# Patient Record
Sex: Female | Born: 1940 | Race: Black or African American | Hispanic: No | Smoking: Never smoker
Health system: Southern US, Community
[De-identification: ages and names within clinical notes are randomized; demographics above are authoritative.]

## PROBLEM LIST (undated history)

## (undated) DIAGNOSIS — E785 Hyperlipidemia, unspecified: Secondary | ICD-10-CM

## (undated) DIAGNOSIS — I10 Essential (primary) hypertension: Secondary | ICD-10-CM

## (undated) HISTORY — PX: COLON SURGERY: SHX602

## (undated) HISTORY — PX: ABDOMINAL SURGERY: SHX537

## (undated) HISTORY — PX: COLOSTOMY REVERSAL: SHX5782

---

## 1999-03-09 ENCOUNTER — Other Ambulatory Visit: Admission: RE | Admit: 1999-03-09 | Discharge: 1999-03-09 | Payer: Self-pay | Admitting: *Deleted

## 2001-03-16 ENCOUNTER — Other Ambulatory Visit: Admission: RE | Admit: 2001-03-16 | Discharge: 2001-03-16 | Payer: Self-pay | Admitting: *Deleted

## 2005-05-05 ENCOUNTER — Encounter: Admission: RE | Admit: 2005-05-05 | Discharge: 2005-08-03 | Payer: Self-pay | Admitting: Family Medicine

## 2008-01-31 ENCOUNTER — Inpatient Hospital Stay (HOSPITAL_COMMUNITY): Admission: RE | Admit: 2008-01-31 | Discharge: 2008-02-03 | Payer: Self-pay | Admitting: Orthopedic Surgery

## 2008-02-08 ENCOUNTER — Ambulatory Visit: Payer: Self-pay

## 2008-12-25 ENCOUNTER — Inpatient Hospital Stay (HOSPITAL_COMMUNITY): Admission: RE | Admit: 2008-12-25 | Discharge: 2008-12-29 | Payer: Self-pay | Admitting: Orthopedic Surgery

## 2009-12-09 ENCOUNTER — Encounter: Admission: RE | Admit: 2009-12-09 | Discharge: 2009-12-09 | Payer: Self-pay | Admitting: Sports Medicine

## 2010-11-24 LAB — CBC
HCT: 22.8 % — ABNORMAL LOW (ref 36.0–46.0)
HCT: 29.7 % — ABNORMAL LOW (ref 36.0–46.0)
HCT: 35.7 % — ABNORMAL LOW (ref 36.0–46.0)
Hemoglobin: 10.3 g/dL — ABNORMAL LOW (ref 12.0–15.0)
Hemoglobin: 8.9 g/dL — ABNORMAL LOW (ref 12.0–15.0)
MCHC: 34.1 g/dL (ref 30.0–36.0)
MCHC: 34.2 g/dL (ref 30.0–36.0)
MCV: 83.7 fL (ref 78.0–100.0)
MCV: 83.8 fL (ref 78.0–100.0)
MCV: 92.1 fL (ref 78.0–100.0)
Platelets: 169 10*3/uL (ref 150–400)
Platelets: 171 10*3/uL (ref 150–400)
Platelets: 211 10*3/uL (ref 150–400)
Platelets: 285 10*3/uL (ref 150–400)
RBC: 2.48 MIL/uL — ABNORMAL LOW (ref 3.87–5.11)
RBC: 3.09 MIL/uL — ABNORMAL LOW (ref 3.87–5.11)
RBC: 3.58 MIL/uL — ABNORMAL LOW (ref 3.87–5.11)
RBC: 3.96 MIL/uL (ref 3.87–5.11)
RDW: 22 % — ABNORMAL HIGH (ref 11.5–15.5)
RDW: 22.4 % — ABNORMAL HIGH (ref 11.5–15.5)
WBC: 5.7 10*3/uL (ref 4.0–10.5)
WBC: 8.2 10*3/uL (ref 4.0–10.5)
WBC: 9.2 10*3/uL (ref 4.0–10.5)

## 2010-11-24 LAB — BASIC METABOLIC PANEL
BUN: 10 mg/dL (ref 6–23)
Calcium: 8 mg/dL — ABNORMAL LOW (ref 8.4–10.5)
Calcium: 8.3 mg/dL — ABNORMAL LOW (ref 8.4–10.5)
Chloride: 94 mEq/L — ABNORMAL LOW (ref 96–112)
Chloride: 95 mEq/L — ABNORMAL LOW (ref 96–112)
Creatinine, Ser: 0.7 mg/dL (ref 0.4–1.2)
Creatinine, Ser: 0.8 mg/dL (ref 0.4–1.2)
Creatinine, Ser: 0.93 mg/dL (ref 0.4–1.2)
GFR calc Af Amer: >60 mL/min (ref >60)
GFR calc Af Amer: >60 mL/min (ref >60)
GFR calc non Af Amer: >60 mL/min (ref >60)
Potassium: 3.5 mEq/L (ref 3.5–5.1)
Potassium: 3.5 mEq/L (ref 3.5–5.1)
Potassium: 4.1 mEq/L (ref 3.5–5.1)
Sodium: 131 mEq/L — ABNORMAL LOW (ref 135–145)
Sodium: 134 mEq/L — ABNORMAL LOW (ref 135–145)

## 2010-11-24 LAB — COMPREHENSIVE METABOLIC PANEL
ALT: 21 U/L (ref 0–35)
AST: 32 U/L (ref 0–37)
Alkaline Phosphatase: 77 U/L (ref 39–117)
BUN: 27 mg/dL — ABNORMAL HIGH (ref 6–23)
Calcium: 9.7 mg/dL (ref 8.4–10.5)
Chloride: 106 mEq/L (ref 96–112)
GFR calc Af Amer: >60 mL/min (ref >60)
GFR calc non Af Amer: >60 mL/min (ref >60)
Glucose, Bld: 98 mg/dL (ref 70–99)
Total Bilirubin: 0.5 mg/dL (ref 0.3–1.2)

## 2010-11-24 LAB — PROTIME-INR
INR: 2.8 — ABNORMAL HIGH (ref 0.00–1.49)
INR: 1 (ref 0.00–1.49)
INR: 2.2 — ABNORMAL HIGH (ref 0.00–1.49)
Prothrombin Time: 31.8 seconds — ABNORMAL HIGH (ref 11.6–15.2)
Prothrombin Time: 13.5 seconds (ref 11.6–15.2)

## 2010-11-24 LAB — TYPE AND SCREEN

## 2010-12-29 NOTE — Op Note (Signed)
NAME:  Mandy Bowman, Mandy Bowman NO.:  [id number]   MEDICAL RECORD NO.:  [id number]          PATIENT TYPE:  INP   LOCATION:  5032                         FACILITY:  [facility]   PHYSICIAN:  Loreta Ave, M.D. DATE OF BIRTH:  06-Dec-1940   DATE OF PROCEDURE:  DATE OF DISCHARGE:                               OPERATIVE REPORT   PREOPERATIVE DIAGNOSES:  End-stage degenerative arthritis, left knee,  varus alignment flexion contracture.   POSTOPERATIVE DIAGNOSES:  End-stage degenerative arthritis, left knee,  varus alignment flexion contracture.   PROCEDURE:  Left total knee replacement, modified minimally invasive  approach.  Scientist, research (medical).  Cemented pegged posterior  stabilized #2 femoral component.  Cemented #2 tibial component with 11-  mm posterior stabilized polyethylene insert.  Resurfacing cemented  pegged medial offset 29-mm patellar component.  Soft tissue balancing  medial capsule release.  Removal of numerous loose bodies and spurs.   SURGEON:  Loreta Ave, M.D.   ASSISTANT:  Genene Churn. Denton Meek., present throughout the entire case.   ANESTHESIA:  General.   BLOOD LOSS:  Minimal.   TOURNIQUET TIME:  1 hour and 20 minutes.   SPECIMENS:  None.   COUNTS:  None.   COMPLICATIONS:  None.   DRESSING:  Soft compressive with knee immobilizer.   DRAINS:  Hemovac x1.   PROCEDURE:  The patient was brought to the operating room and after  adequate anesthesia had been obtained, left knee examined; 5 degrees of  flexion contracture, alignment of varus correctable to neutral.  Flexion  limited greater than 90 degrees.  Tourniquet applied, prepped and draped  in the usual sterile fashion.  Extremity with elevation, Esmarch  tourniquet inflated to 350 mmHg.  Straight incision above the patella  down to tibial tubercle.  Medial arthrotomy to the superomedial border  of patella and then vastus splitting preserving quad tendon.  Knee  exposed.   Periarticular spurs, numerous loose bodies, hypertrophic  synovitis, remnants of menisci, cruciate ligaments removed.  Medial  capsule released.  Distal femur exposed.  Intramedullary guide placed.  Distal cut set at 5 degree of valgus resecting 10 mm.  Using epicondylar  axis sized, cut, and fitted for a #2 posterior stabilized component,  which fit well.  Proximal tibia exposed.  Extramedullary guide 3 degree  of posterior slope cut.  Resected just below the defect medially.  Size  #2 component.  All recess examined to be sure all spurs are removed.  Patella exposed, spurs removed, posterior 9-mm removed, drilled sized,  and fitted for 29-mm component.  Trials put in place.  #2 on the femur,  #2 on the tibia.  With the 11-mm insert full extension, full flexion,  nicely balanced knee.  Also excellent patellofemoral tracking.  Tibia  was marked for rotation and hand reamed.  All trials removed.  Copious  irrigation with a pulse irrigator device.  Cement prepared placed on all  components, which were firmly seated.  Polyethylene attached to tibia.  The knee reduced.  Once cement hardened, it was reexamined.  Full  extension, full flexion, normal mechanical axis, good  alignment, good  stability, good patellofemoral tracking.  Wound irrigated.  Hemovac  placed, brought out through a separate stab wound.  Arthrotomy closed #1  Vicryl, skin and subcutaneous tissue with Vicryl and staples.  Sterile  compressive dressing applied.  Tourniquet inflated and removed.  Knee  immobilizer applied.  Anesthesia reversed.  Brought to recovery room.  Tolerated surgery well.  No complications.      Loreta Ave, M.D.  Electronically Signed     DFM/MEDQ  D:  01/31/2008  T:  02/01/2008  Job:  [id number]

## 2010-12-29 NOTE — Op Note (Signed)
NAME:  Mandy Bowman, Mandy Bowman NO.:  [id number]   MEDICAL RECORD NO.:  [id number]          PATIENT TYPE:  INP   LOCATION:  5028                         FACILITY:  [facility]   PHYSICIAN:  Loreta Ave, M.D. DATE OF BIRTH:  07/18/1941   DATE OF PROCEDURE:  12/25/2008  DATE OF DISCHARGE:                               OPERATIVE REPORT   PREOPERATIVE DIAGNOSIS:  Right knee end-stage degenerative arthritis,  varus alignment.   POSTOPERATIVE DIAGNOSIS:  Right knee end-stage degenerative arthritis,  varus alignment.   PROCEDURE:  Modified minimally invasive right total knee replacement  with St[employer]ker Triathlon prosthesis.  Soft tissue balancing.  Cemented  pegged posterior stabilized #2 femoral component.  Cemented #2 tibial  component 11-mm polyethylene insert.  Resurfacing cemented medial offset  29-mm patellar component.   SURGEON:  Loreta Ave, MD   ASSISTANT:  Genene Churn. Barry Dienes, [place], present throughout the entire case and  necessary for timely completion of procedure.   ANESTHESIA:  General.   BLOOD LOSS:  Minimal.   TOURNIQUET TIME:  1 hour.   SPECIMENS:  None.   CULTURES:  None.   COMPLICATIONS:  None.   DRESSING:  Sterile compressive with knee immobilizer.   DRAIN:  Hemovac x1.   PROCEDURE:  The patient was brought to the operating room and placed on  operating table in supine position.  After adequate anesthesia had been  obtained, right knee examined.  Varus alignment correctable to neutral.  Just about full extension, flexion better than 100 degrees.  Tourniquet  applied, prepped and draped in usual sterile fashion.  Exsanguinated  with elevation and Esmarch.  Tourniquet inflated to 300 mmHg.  Straight  incision above the patella down to tibial tubercle.  Medial arthrotomy  up into the vastus, splitting incision preserving quad tendon.  Knee  exposed.  Grade 4 changes throughout.  Remnants of menisci, cruciate  ligaments.  Periarticular spurs and  loose bodies removed.  Distal femur  exposed.  Intramedullary guide placed.  Distal cut 10-mm set at 5  degrees valgus.  Using epicondylar axis, sized, cut, and fitted for a  posterior stabilized #2 femoral component.  Proximal tibia exposed.  A 3-  degree posterior slope cut.  Extramedullary guide.  Brought down below  the medial defect.  Size to #2 component.  Patella exposed.  Loose  fragments up in the tendon above the patella removed.  Periarticular  spurs removed.  Posterior 9-mm sawed off the back of patella.  Sized,  drilled, and fitted for a 29-mm component.  Trials put in place.  A #2  on the femur, #2 on the tibia, and the 29 on the patella.  With the 11-  mm insert and a medial capsule release, nicely balanced knee and normal  mechanical axis, good alignment, good stability, and good patellofemoral  tracking.  Tibia was marked for appropriate rotation and reamed.  Trials  removed.  The tibia punched for the keel on that component.  Copious  irrigation with a pulse irrigating device.  Cement prepared and placed  on all components which were firmly seated.  Excessive  cement removed.  Polyethylene attached to tibia.  Patellar clamp put in place.  Once the  cement hardened, it was reexamined.  Full extension, full flexion, good  alignment, good stability, and good patellofemoral tracking.  Hemovac  placed through a separate stab wound.  Arthrotomy closed #1 Vicryl.  Skin and subcutaneous tissue with Vicryl and staples.  Knee injected  with Marcaine.  Hemovac clamp.  Sterile compressive dressing applied.  Tourniquet deflated and removed.  Knee immobilizer applied.  Anesthesia  reversed.  Brought to recovery room.  Tolerated surgery well.  No  complications.      Loreta Ave, M.D.  Electronically Signed     Loreta Ave, M.D.  Electronically Signed    DFM/MEDQ  D:  12/25/2008  T:  12/26/2008  Job:  [id number]

## 2011-01-01 NOTE — Discharge Summary (Signed)
NAME:  Mandy Bowman, Mandy Bowman NO.:  [id number]   MEDICAL RECORD NO.:  [id number]          PATIENT TYPE:  INP   LOCATION:  5032                         FACILITY:  [facility]   PHYSICIAN:  Loreta Ave, M.D. DATE OF BIRTH:  1941/01/23   DATE OF ADMISSION:  01/31/2008  DATE OF DISCHARGE:  02/03/2008                               DISCHARGE SUMMARY   FINAL DIAGNOSES:  1. Status post a left total knee replacement for end-stage      degenerative joint disease.  2. Hyperlipidemia.  3. Asthma.  4. Hypertension.  5. Depression.   HISTORY OF PRESENT ILLNESS:  A 70 year old black female with a history  of end-stage DJD left knee and chronic pain presented to our office for  preop evaluation for total knee replacement.  She has progressive  worsening pain, which failed to response with conservative treatment.  Significant decrease in her daily activities due to the ongoing  complaint.   HOSPITAL COURSE:  On January 31, 2008, the patient was taken to the [facility] OR and a left total knee replacement procedure was performed.   SURGEON:  Loreta Ave, M.D.   ASSISTANT:  Genene Churn. Barry Dienes, PA-C   ANESTHESIA:  General.   SPECIMENS:  No specimens.   ESTIMATED BLOOD LOSS:  Minimal.   TOURNIQUET TIME:  1 hour and 26 minutes.   DRAINS:  One Hemovac drain placed.   COMPLICATIONS:  There were no surgical or anesthesia complications, and  the patient was transferred to recovery in stable condition.   On February 01, 2008, the patient was doing well.  She complaint of left  knee pain.  Vitals are stable and is afebrile.  Hemoglobin 9.5 and  hematocrit 27.5.  Dressing is clean, dry, and intact.  Calf nontender.  Neurovascular intact distally.  Hold blood pressure med.  Pharmacy  protocol Coumadin started DVT prophylaxis.  On February 02, 2008, the  patient was doing well.  Pain is controlled.  Vitals are stable and is  afebrile.  Hemoglobin 9.2, hematocrit 26.5, and INR 1.7.  The wound  looks good.  Staples intact.  No drainage or signs of infection.  Hemovac drain discontinued.  Calf nontender.  Neurovascular intact.  Discontinued Foley, PCA and saline locked IV.  Started FeSO4 325 mg p.o.  b.i.d. with meals.  On February 03, 2008, the patient doing extremely well.  Excellent progress with therapy.  She is ready to go home.  Vital signs  stable and is afebrile.  The wound looks good.  Staples intact.  No  drainage or signs of infection.   RECOMMENDATIONS:  Good and stable.   DISPOSITION:  Discharge home.   MEDICATIONS:  1. Percocet 5/325 mg 1 to 2 tablets p.o. every 4-6 hours p.r.n. pain.  2. Robaxin 500 mg 1 tablets p.o. every 6 hours p.r.n. for spasms.  3. Coumadin pharmacy protocol.  4. Resume her previous home meds.   INSTRUCTIONS:  The patient will work with home health PT and OT to  improve ambulation and knee range of motion and strengthening.  Daily  dressing changes with  4 x 4 gauze and tape.  Weightbearing as tolerated.  Coumadin x4 with postoperative DVT prophylaxis.  Follow up in 2 weeks  for postoperative recheck.  Return sooner if needed.      Loreta Ave, M.D.  Electronically Signed     Loreta Ave, M.D.  Electronically Signed    DFM/MEDQ  D:  02/26/2008  T:  02/27/2008  Job:  [id number]

## 2011-01-01 NOTE — Discharge Summary (Signed)
NAME:  Mandy Bowman, Mandy Bowman NO.:  [id number]   MEDICAL RECORD NO.:  [id number]          PATIENT TYPE:  INP   LOCATION:  5028                         FACILITY:  [facility]   PHYSICIAN:  Loreta Ave, M.D. DATE OF BIRTH:  04/15/1941   DATE OF ADMISSION:  12/25/2008  DATE OF DISCHARGE:  12/29/2008                               DISCHARGE SUMMARY   FINAL DIAGNOSES:  1. Status post right total knee replacement for end-stage degenerative      joint disease.  2. Hypertension.  3. Hyperlipidemia.  4. Asthma.  5. Depressive disorder.   HISTORY OF PRESENT ILLNESS:  A 70 year old black female with history of  end-stage DJD, right knee and chronic pain, presented to our office for  preop evaluation for total knee replacement.  She had progressively  worsening pain with failure to response with conservative treatment.  Significant decrease in her daily activities due to the ongoing  complaint.   HOSPITAL COURSE:  On Dec 25, 2008, the patient was taken to the [facility] OR, right total knee replacement procedure performed.  The surgeon  is Mckinley Jewel, and assistant is Zonia Kief, PA-C.  Anesthesia  general with femoral nerve block.  No specimens.  EBL minimal.  Tourniquet time 62 minutes.  One Hemovac drain placed.  There were no  surgical or anesthesia complications and the patient was transferred to  recovery in stable condition.  On Dec 26, 2008, the patient complaining  of right knee pain.  Denied chest pain or shortness of breath.  Vital  signs stable, afebrile.  Hemoglobin 7.9, hematocrit 22.8.  Preop  hemoglobin 12.4.  INR 1.3.  Slight bleeding through dressing.  Neurovascularly intact.  Calf nontender.  Sodium 133.  So, we changed IV  to normal saline plus 20 KCl.  Acute blood loss anemia.  Transfused 2  units of packed red blood cells.  Pharmacy protocol.  Coumadin started  for DVT prophylaxis along with Lovenox.  PT and OT consults.  Dec 27, 2008, the patient  doing well until not better after transfusion.  Temperature 99, pulse 75, respirations 20, blood pressure 134/63.  Hemoglobin 10.3, hematocrit 29.7.  Sodium 131, potassium 2.9, chloride  91, CO2 27, BUN 10, creatinine 0.80, glucose 120, INR 1.4.  Wound looks  good and staples intact.  No drainage or sign of infection.  Hemovac  drain pulled.  KCl 60 mEq p.o. x1 dose.  Repeat BMET in the afternoon.  Dec 28, 2008, the patient doing well.  Vital signs stable, afebrile.  Hemoglobin 8.9, hematocrit 25.9.  Sodium 134, potassium 4.1, chloride  95, CO2 28, BUN 8, creatinine 0.70, glucose 127.  Wound looks good and  staples intact.  No drainage or sign of infection.  Calf nontender.  Neurovascularly intact.  Progressing with therapy.  Dec 29, 2008, the  patient doing well and states she is ready to discharge home.  Temperature 99.4, pulse 74, respirations 18, blood pressure 143/75.  INR  2.8.  Wound looks good and staples intact.  No drainage or sign of  infection.  She has done well  with therapy and ready for discharge home.   CONDITION:  Good and stable.   DISPOSITION:  Discharge home.   MEDICATIONS:  1. Percocet 7.5/325 one to two tablets p.o. q.4-6 h. p.r.n. for pain.  2. Robaxin 500 mg one tablet p.o. q.6 h. for spasms 0.3.  3. Coumadin pharmacy protocol.  Maintain INR 2-3 until 4 weeks postop.  4. Resume previous home meds except for anti-inflammatories.   INSTRUCTIONS:  The patient will work with home health PT and OT to  improve ambulation and knee range of motion and strengthening.  Weightbear as tolerated.  Daily dressing changes with 4 x 4 gauze and  tape.  Coumadin x4 weeks postop for DVT prophylaxis.   FOLLOWUP:  She is 2 weeks postop for recheck.  Return sooner if needed.      Genene Churn. Denton Meek.      Loreta Ave, M.D.  Electronically Signed    JMO/MEDQ  D:  02/18/2009  T:  02/19/2009  Job:  [id number]

## 2011-05-13 LAB — CBC
HCT: 36.9
HCT: 25.3 — ABNORMAL LOW
HCT: 27.5 — ABNORMAL LOW
Hemoglobin: 12.6
Hemoglobin: 9.2 — ABNORMAL LOW
Hemoglobin: 9.5 — ABNORMAL LOW
MCHC: 34.3
MCHC: 34.2
MCHC: 34.7
MCV: 90.2
MCV: 90.9
MCV: 91.1
MCV: 91.8
Platelets: 269
RBC: 2.92 — ABNORMAL LOW
RDW: 12.7
RDW: 12.7
RDW: 13.2

## 2011-05-13 LAB — BASIC METABOLIC PANEL
BUN: 7
CO2: 27
Chloride: 100
Chloride: 101
Chloride: 107
Creatinine, Ser: 0.77
GFR calc Af Amer: >60
GFR calc non Af Amer: 52 — ABNORMAL LOW
Glucose, Bld: 116 — ABNORMAL HIGH
Glucose, Bld: 129 — ABNORMAL HIGH
Glucose, Bld: 95
Potassium: 4.1
Potassium: 4.6
Sodium: 134 — ABNORMAL LOW
Sodium: 135

## 2011-05-13 LAB — PROTIME-INR
INR: 1
Prothrombin Time: 13.5

## 2011-05-13 LAB — COMPREHENSIVE METABOLIC PANEL
BUN: 20
Calcium: 9.4
Creatinine, Ser: 0.78
Glucose, Bld: 94
Total Protein: 7.7

## 2011-05-13 LAB — URINALYSIS, ROUTINE W REFLEX MICROSCOPIC
Bilirubin Urine: NEGATIVE
Glucose, UA: NEGATIVE
Hgb urine dipstick: NEGATIVE
Nitrite: NEGATIVE
Specific Gravity, Urine: 1.018
pH: 6.5

## 2011-05-13 LAB — TYPE AND SCREEN

## 2011-08-13 ENCOUNTER — Other Ambulatory Visit: Payer: Self-pay | Admitting: Sports Medicine

## 2011-08-13 ENCOUNTER — Ambulatory Visit
Admission: RE | Admit: 2011-08-13 | Discharge: 2011-08-13 | Disposition: A | Discharge status: Home / Self Care | Payer: Medicare Other | Source: Ambulatory Visit | Attending: Sports Medicine | Admitting: Sports Medicine

## 2011-08-13 DIAGNOSIS — R52 Pain, unspecified: Secondary | ICD-10-CM

## 2012-09-21 IMAGING — CT CT ELBOW*R* W/O CM
3 of 8 series · 14 of 33 positions shown, 17 images · non-contrast
Comparison: None.

CLINICAL DATA: Fall with elbow pain.

CT OF THE RIGHT ELBOW WITHOUT CONTRAST
TECHNIQUE: Multidetector CT imaging was performed according to the
standard protocol. Multiplanar CT image reconstructions were also
generated.

[Series 7: thin recons · axial · 0.23mm/px · z∈[-37,+22]mm · 6 of 265 slices shown, 8 images]
[im 38/265  soft-tissue]
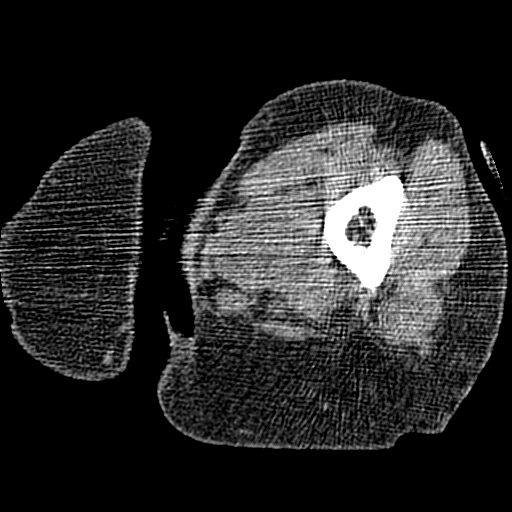
[im 38/265  bone]
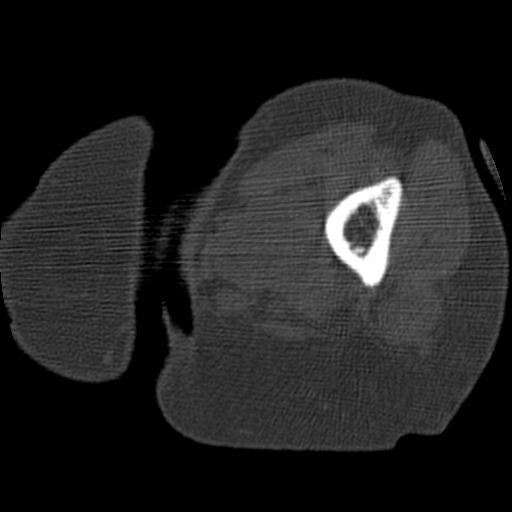
[im 76/265  bone]
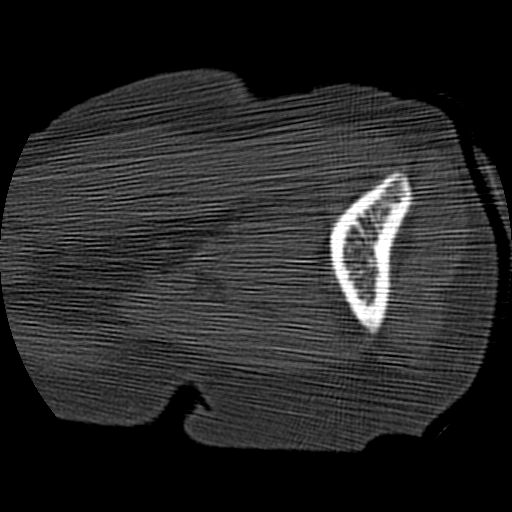
[im 114/265  bone]
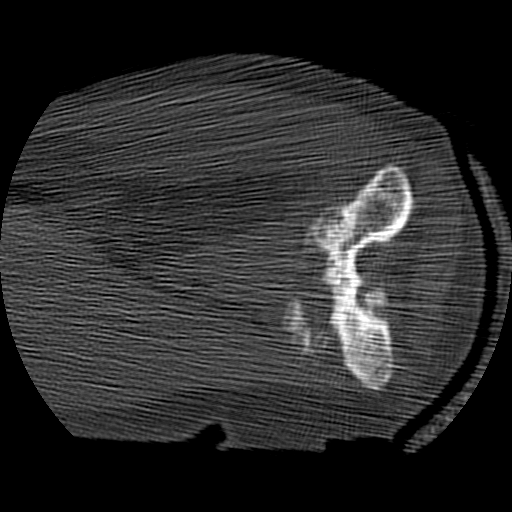
[im 151/265  bone]
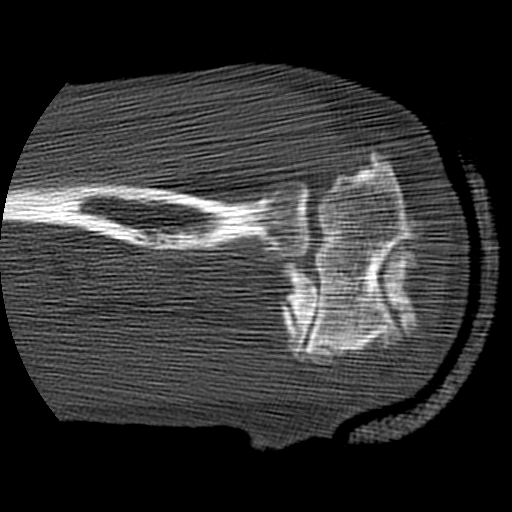
[im 189/265  soft-tissue]
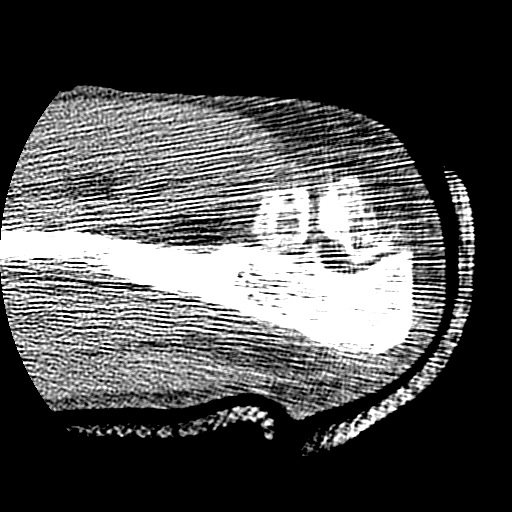
[im 189/265  bone]
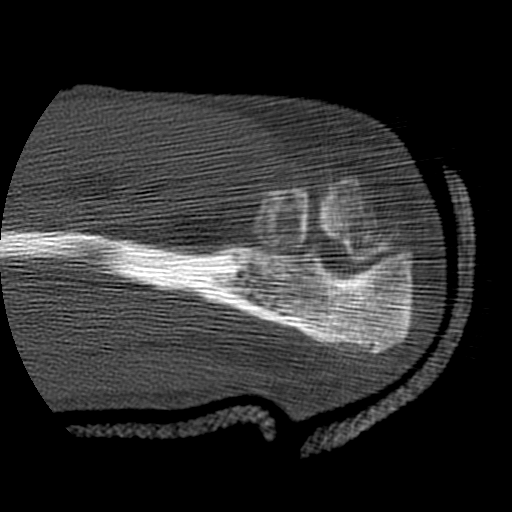
[im 227/265  bone]
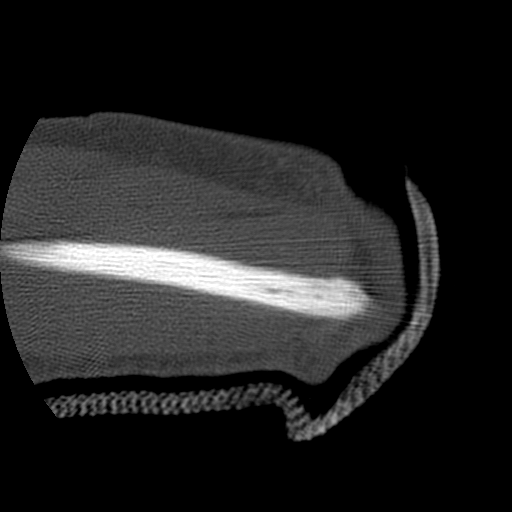

[Series 701: cor · sagittal · 0.23mm/px · 5 of 30 slices shown, 6 images]
[im 10/30  bone]
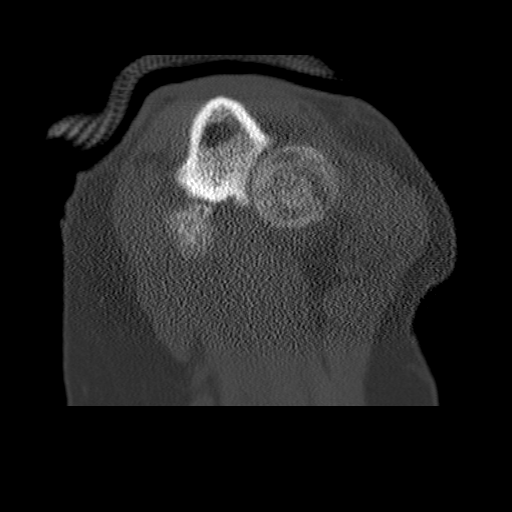
[im 13/30  bone]
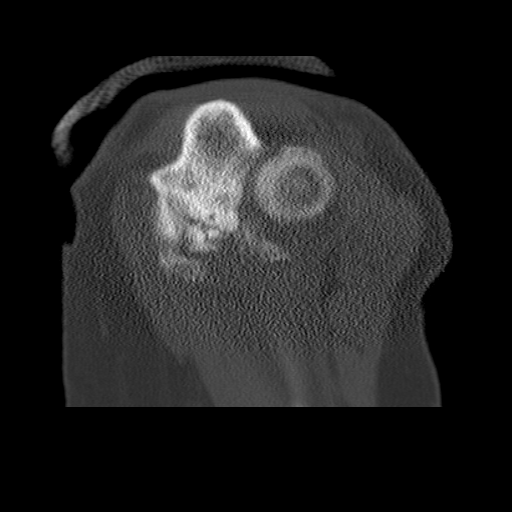
[im 15/30  soft-tissue]
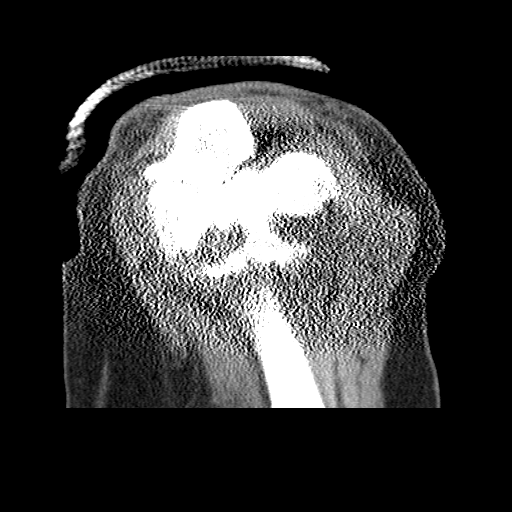
[im 15/30  bone]
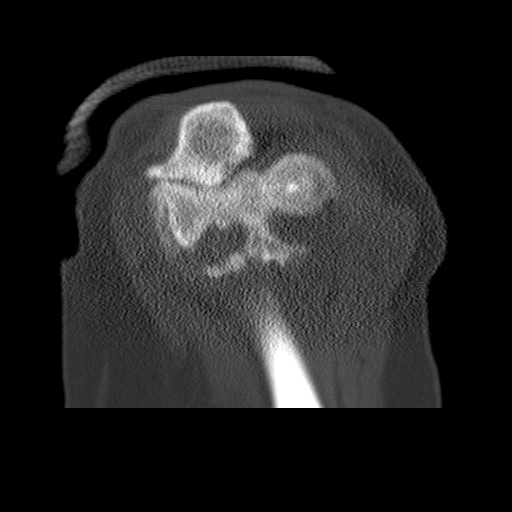
[im 17/30  bone]
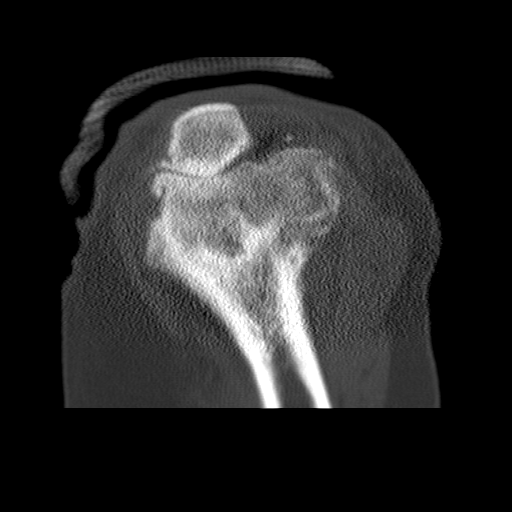
[im 20/30  bone]
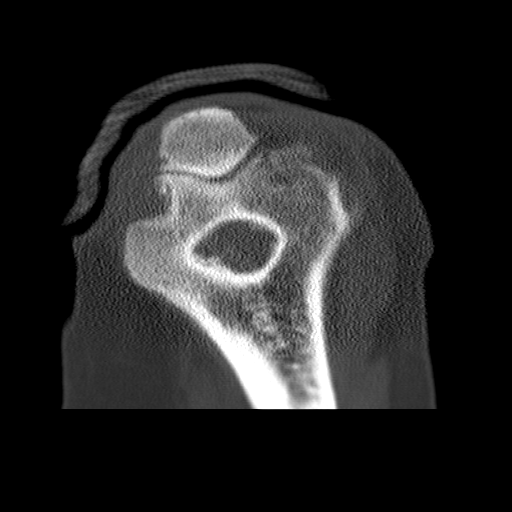

[Series 703: axial r&u · sagittal · 0.23mm/px · 3 of 51 slices shown]
[im 11/51  bone]
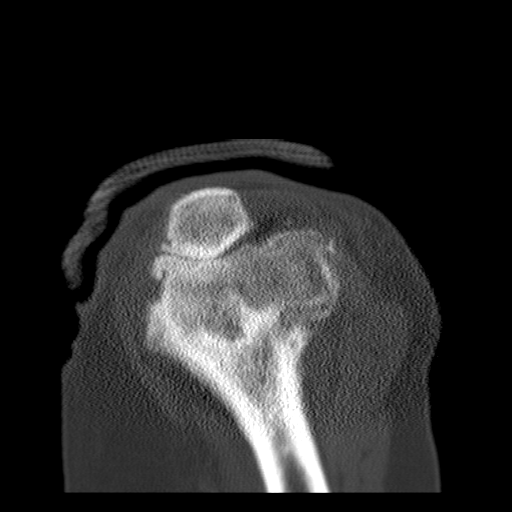
[im 21/51  bone]
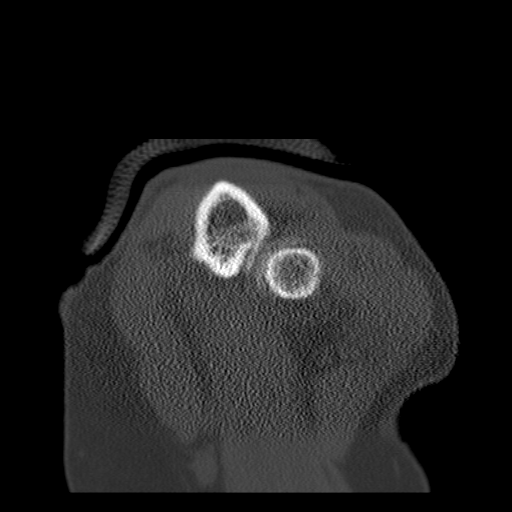
[im 31/51  bone]
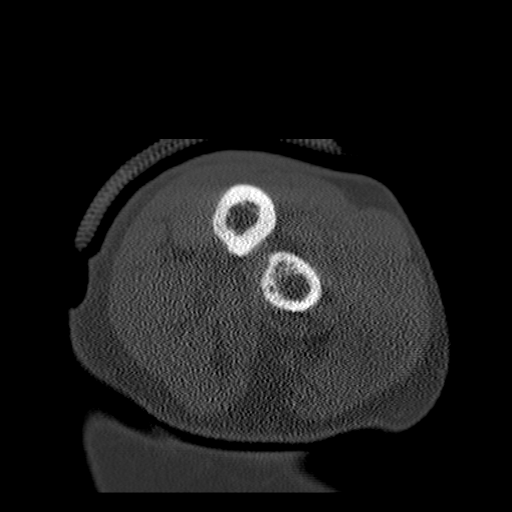

[14 of 33 positions shown; findings below may reference images not displayed]

FINDINGS: Prominent degenerative spurring noted along the articular
margins in the elbow, involving the distal humerus, proximal
radius, and proximal ulna.

The elbow was imaged an 90 degrees of flexion, reducing diagnostic
sensitivity and specificity.

Chronically fragmented and well corticated osteophytes likely
donated from the coronoid process are present anterior to the elbow
joint.  Slightly fragmented olecranon osteophytes noted.

There is considerable loss of articular space in the elbow, with
degenerative subcortical cyst formation noted along the trochlear
groove and olecranon.  There is prominent anterior supracondylar
spurring with chronic-appearing irregularity which could reflect a
remote prior fracture, although currently cortical discontinuity is
not observed.  A small to moderate elbow effusion is present, with
the posterior fat pad displaced posteriorly.
IMPRESSION: 1.  Severe degenerative arthropathy of the elbow, with fragmented
osteophytes which appear well corticated and with a small to
moderate elbow joint effusion, but without a well-defined fracture
observed.  If symptoms persist despite conservative therapy, MRI
followup may be warranted.

## 2014-10-27 ENCOUNTER — Emergency Department (HOSPITAL_COMMUNITY)
Admission: EM | Admit: 2014-10-27 | Discharge: 2014-10-27 | Disposition: A | Discharge status: Home / Self Care | Payer: Medicare Other | Attending: Emergency Medicine | Admitting: Emergency Medicine

## 2014-10-27 ENCOUNTER — Encounter (HOSPITAL_COMMUNITY): Payer: Self-pay | Admitting: *Deleted

## 2014-10-27 DIAGNOSIS — K088 Other specified disorders of teeth and supporting structures: Secondary | ICD-10-CM | POA: Diagnosis not present

## 2014-10-27 DIAGNOSIS — I10 Essential (primary) hypertension: Secondary | ICD-10-CM | POA: Diagnosis not present

## 2014-10-27 DIAGNOSIS — Z7982 Long term (current) use of aspirin: Secondary | ICD-10-CM | POA: Diagnosis not present

## 2014-10-27 DIAGNOSIS — K0889 Other specified disorders of teeth and supporting structures: Secondary | ICD-10-CM

## 2014-10-27 DIAGNOSIS — Z79899 Other long term (current) drug therapy: Secondary | ICD-10-CM | POA: Diagnosis not present

## 2014-10-27 DIAGNOSIS — K029 Dental caries, unspecified: Secondary | ICD-10-CM | POA: Diagnosis not present

## 2014-10-27 HISTORY — DX: Essential (primary) hypertension: I10

## 2014-10-27 MED ORDER — AMOXICILLIN 500 MG PO CAPS: 500.0000 mg | ORAL_CAPSULE | Freq: Three times a day (TID) | ORAL | Status: DC

## 2014-10-27 MED ORDER — HYDROCODONE-ACETAMINOPHEN 5-325 MG PO TABS: 1.0000 | ORAL_TABLET | Freq: Four times a day (QID) | ORAL | Status: DC | PRN

## 2014-10-27 NOTE — ED Provider Notes (Signed)
CSN: [id number]     Arrival date & time 10/27/14  1318 History  This chart was scribed for non-physician practitioner, Arthor CaptainAbigail Reakwon Barren, PA-C,working with Cathren LaineKevin Steinl, MD, by Karle PlumberJennifer Tensley, ED Scribe. This patient was seen in room WTR9/WTR9 and the patient's care was started at 2:34 PM.  Chief Complaint  Patient presents with  . Dental Pain   Patient is a 74 y.o. female presenting with tooth pain. The history is provided by the patient and medical records. No language interpreter was used.  Dental Pain   HPI Comments:  Mandy Bowman is a 74 y.o. female who presents to the Emergency Department complaining of severe right upper dental pain that began two days ago. She reports associated facial swelling. Pt reports she fractured a tooth in the past but it has not been bothering her until recently. She reports using Orajel and taking Tylenol with no significant relief of the pain. Eating makes the pain worse. Denies alleviating factors. Denies fever, chills, nausea or vomiting. PMHx of HTN.  Past Medical History  Diagnosis Date  . Hypertension    History reviewed. No pertinent past surgical history. No family history on file. History  Substance Use Topics  . Smoking status: Never Smoker   . Smokeless tobacco: Not on file  . Alcohol Use: Yes   OB History    No data available     Review of Systems  HENT: Positive for dental problem.     Allergies  Review of patient's allergies indicates no known allergies.  Home Medications   Prior to Admission medications   Medication Sig Start Date End Date Taking? Authorizing Provider  acetaminophen (TYLENOL) 500 MG tablet Take 1,000 mg by mouth every 6 (six) hours as needed for mild pain or headache.   Yes Historical Provider, MD  amLODipine (NORVASC) 10 MG tablet Take 10 mg by mouth daily.   Yes Historical Provider, MD  aspirin EC 81 MG tablet Take 81 mg by mouth daily.   Yes Historical Provider, MD  Calcium Carbonate-Vitamin D (CALTRATE  600+D PO) Take 1 tablet by mouth 2 (two) times daily.   Yes Historical Provider, MD  diclofenac (VOLTAREN) 75 MG EC tablet Take 75 mg by mouth daily as needed (for pain).   Yes Historical Provider, MD  losartan-hydrochlorothiazide (HYZAAR) 100-25 MG per tablet Take 1 tablet by mouth daily.   Yes Historical Provider, MD  Multiple Vitamin (MULTIVITAMIN WITH MINERALS) TABS tablet Take 1 tablet by mouth daily.   Yes Historical Provider, MD  potassium chloride SA (K-DUR,KLOR-CON) 20 MEQ tablet Take 20 mEq by mouth 2 (two) times daily.   Yes Historical Provider, MD  pravastatin (PRAVACHOL) 20 MG tablet Take 20 mg by mouth daily.   Yes Historical Provider, MD  ranitidine (ZANTAC) 150 MG tablet Take 150 mg by mouth 2 (two) times daily as needed for heartburn.   Yes Historical Provider, MD  Vitamin D, Cholecalciferol, 1000 UNITS CAPS Take 1,000 Units by mouth daily.   Yes Historical Provider, MD   .Triage Vitals: BP 157/76 mmHg  Pulse 103  Temp(Src) 98.1 F (36.7 C) (Oral)  Resp 16  SpO2 100% Physical Exam  Constitutional: She is oriented to person, place, and time. She appears well-developed and well-nourished.  HENT:  Head: Normocephalic and atraumatic.  Significant decay of most upper teeth. Right upper canine with gingival erythema. No obvious abscess.  Eyes: EOM are normal.  Neck: Normal range of motion.  Cardiovascular: Normal rate.   Pulmonary/Chest: Effort normal.  Musculoskeletal: Normal range of motion.  Neurological: She is alert and oriented to person, place, and time.  Skin: Skin is warm and dry.  Psychiatric: She has a normal mood and affect. Her behavior is normal.  Nursing note and vitals reviewed.   ED Course  Procedures (including critical care time) DIAGNOSTIC STUDIES: Oxygen Saturation is 100% on RA, normal by my interpretation.   COORDINATION OF CARE: 2:36 PM- Will prescribe pain medication and antibiotics. Pt verbalizes understanding and agrees to  plan.  Medications - No data to display  Labs Review Labs Reviewed - No data to display  Imaging Review No results found.   EKG Interpretation None      MDM   Final diagnoses:  Dentalgia    Patient with toothache.  No gross abscess.  Exam unconcerning for Ludwig's angina or spread of infection.  Will treat with penicillin and pain medicine.  Urged patient to follow-up with dentist.     I personally performed the services described in this documentation, which was scribed in my presence. The recorded information has been reviewed and is accurate.    Arthor Captain, PA-C 10/29/14 1044  Cathren Laine, MD 10/31/14 [phone]

## 2014-10-27 NOTE — Discharge Instructions (Signed)
You have been diagnosed with Dental pain. Please call the follow up dentist first thing in the morning on Monday for a follow up appointment. Keep your discharge paperwork from today's visit to bring to the dentist office. You may also use the resource guide listed below to help you find a dentist if you do not already have one to followup with. It is very important that you get evaluated by a dentist as soon as possible.  Use your pain medication as prescribed and do not operate heavy machinery while on pain medication. Note that your pain medication contains acetaminophen (Tylenol) & its is not reccommended that you use additional acetaminophen (Tylenol) while taking this medication. Take your full course of antibiotics. Read the instructions below. ° °Eat a soft or liquid diet and rinse your mouth out after meals with warm water. You should see a dentist or return here at once if you have increased swelling, increased pain or uncontrolled bleeding from the site of your injury. ° ° °SEEK MEDICAL CARE IF:  °· You have increased pain not controlled with medicines.  °· You have swelling around your tooth, in your face or neck.  °· You have bleeding which starts, continues, or gets worse.  °· You have a fever >101 °· If you are unable to open your mouth °Soft Diet  °The soft diet may be recommended after you were put on a full liquid diet. A normal diet may follow. The soft diet can also be used after surgery if you are too ill to keep down a normal diet. The soft diet may also be needed if you have a hard time chewing foods.  °DESCRIPTION  °Tender foods are used. Foods do not need to be ground or pureed. Most raw fruits and vegetables and coarse breads and cereals should be avoided. Fried foods and highly seasoned foods may cause discomfort.  °NUTRITIONAL ADEQUACY  °A healthy diet is possible if foods from each of the basic food groups are eaten daily.  °SOFT DIET FOOD LISTS  °Milk/Dairy  °Allowed: Milk and milk  drinks, milk shakes, cream cheese, cottage cheese, mild cheeses.  °Avoid: Sharp or highly seasoned cheese. °Meat/Meat Substitutes  °Allowed: Broiled, roasted, baked, or stewed tender lean beef, mutton, lamb, veal, chicken, turkey, liver, ham, crisp bacon, white fish, tuna, salmon. Eggs, smooth peanut butter.  °Avoid: All fried meats, fish, or fowl. Rich gravies and sauces. Lunch meats, sausages, hot dogs. Meats with gristle, chunky peanut butter. °Breads/Grains  °Allowed: Rice, noodles, spaghetti, macaroni. Dry or cooked refined cereals, such as farina, cream of wheat, oatmeal, grits, whole-wheat cereals. Plain or toasted white or wheat blend or whole-grain breads, soda crackers or saltines, flour tortillas.  °Avoid: Wild rice, coarse cereals, such as bran. Seed in or on breads and crackers. Bread or bread products with nuts or seeds. °Fruits/Vegetables  °Allowed: Fruit and vegetable juices, well-cooked or canned fruits and vegetables, any dried fruit. One citrus fruit daily, 1 vitamin A source daily. Well-ripened, easy to chew fruits, sweet potatoes. Baked, boiled, mashed, creamed, scalloped, or au gratin potatoes. Broths or creamed soups made with allowed vegetables, strained tomatoes.  °Avoid: All gas-forming vegetables (corn, radishes, Brussels sprouts, onions, broccoli, cabbage, parsnips, turnips, chili peppers, pinto beans, split peas, dried beans). Fruits containing seeds and skin. Potato chips and corn chips. All others that are not made with allowed vegetables. Highly seasoned soups. °Desserts/Sweets  °Allowed: Simple desserts, such as custard, junkets, gelatin desserts, plain ice cream and sherbets, simple cakes   and cookies, allowed fruits, sugar, syrup, jelly, honey, plain hard candy, and molasses.  °Avoid: Rich pastries, any dessert containing dates, nuts, raisins, or coconut. Fried pastries, such as doughnuts. Chocolate. °Beverages  °Allowed: Fruit and vegetable juices. Caffeine-free carbonated drinks,  coffee, and tea.  °Avoid: Caffeinated beverages: coffee, tea, soda or pop. °Miscellaneous  °Allowed: Butter, cream, margarine, mayonnaise, oil. Cream sauces, salt, and mild spices.  °Avoid: Highly spiced salad dressings. Highly seasoned foods, hot sauce, mustard, horseradish, and pepper. °SAMPLE MENU  °Breakfast  °Orange juice.  °Oatmeal.  °Soft cooked egg.  °Toast and margarine.  °2% milk.  °Coffee. °Lunch  °Meatloaf.  °Mashed potato.  °Green beans.  °Lemon pudding.  °Bread and margarine.  °Coffee. °Dinner  °Consommé or apricot nectar.  °Chicken breast.  °Rice, peas, and carrots.  °Applesauce.  °Bread and margarine.  °2% milk. °To cut the amount of fat in your diet, omit margarine and use 1% or skim milk.  °NUTRIENT ANALYSIS  °Calories........................1953 Kcal.  °Protein.........................102 gm.  °Carbohydrate...............247 gm.  °Fat................................65 gm.  °Cholesterol...................449 mg.  °Dietary fiber.................19 gm.  °Vitamin A.....................2944 RE.  °Vitamin C.....................79 mg.  °Niacin..........................25 mg.  °Riboflavin....................2.0 mg.  °Thiamin.......................1.5 mg.  °Folate..........................249 mcg.  °Calcium.......................1030 mg.  °Phosphorus.................1782 mg.  °Zinc..............................12 mg.  °Iron..............................13 mg.  °Sodium.........................299 mg.  °Potassium....................3046 mg. °Document Released: 11/09/2007 Document Revised: 10/25/2011 Document Reviewed: 11/09/2007  °ExitCare® Patient Information ©2014 [employer].  ° °RESOURCE GUIDE ° ° °Dental Problems ° °Dr. Janna Civilis °$200 dollar visit °[address] °[place], [place] [address]  °[phone] °  ° °Patients with Medicaid: °[facility]                     [facility] °[address]                                           [address] °Phone:   [phone]                                                  Phone:  [phone] ° °If unable to pay or uninsured, contact:  [facility] or [facility] to become qualified for the adult dental clinic. ° °Chronic Pain Problems °Contact [facility]  [phone] °Patients need to be referred by their primary care doctor. ° °Insufficient Money for Medicine °Contact [employer]:  call "211" or [facility] 271-5999. ° °No Primary Care Doctor °Call [facility]  832-8000 °Other agencies that provide inexpensive medical care °   [facility]  [phone] °   [facility]  [phone] °   [facility]  [phone] °   [facility]  [phone] °   [facility]  [phone] °   [facility]  [phone] ° °Psychological Services °[facility]  [phone] °[facility]  [phone] °[facility]   [phone] (emergency services [phone]) ° °Substance Abuse Resources °[facility]  336-882-2125 °[facility] 336-784-9470 °[facility] 336-285-9073 °[facility] 336-845-3988 °Residential & Outpatient Substance Abuse Program  800-659-3381 ° °Abuse/Neglect °[place] [facility] (336) 641-3795 °[place] [facility] Hotline 800-378-5315 (After Hours) ° °Emergency Shelter °   [facility] (336) 271-5985 ° °Maternity Homes °[facility] (336) 275-9566 °[facility] (704) 372-4663 ° °MRSA Hotline #:   832-7006 ° ° ° °[place] [facility] ° °[facility] [facility]     [employer]                          [facility] °[address] [place]                       [address]      [address]  °[place]                                                [place]                            [place] °Phone:  349-3220                                   Phone:  [phone]                 Phone:   [phone] ° °[facility] °Phone:  342-8316 ° °[place] [facility] Hotline °(336) 342-1394 °(336) 342-3537 (After Hours) ° ° ° ° ° ° °

## 2014-10-27 NOTE — ED Notes (Signed)
Pt complains of pain and swelling in her right upper jaw since Friday night. Pt states she has a broken tooth and feels it may be infected. Pt states she has tried orajel on her tooth and took tylenol, which did not provide relief.

## 2019-06-29 ENCOUNTER — Ambulatory Visit: Payer: Self-pay

## 2019-06-29 ENCOUNTER — Other Ambulatory Visit: Payer: Self-pay

## 2019-06-29 DIAGNOSIS — Z20822 Contact with and (suspected) exposure to covid-19: Secondary | ICD-10-CM

## 2019-06-29 NOTE — Telephone Encounter (Signed)
Patient called to asking if she she quarantine until she gets her results.  Patients voiced understanding.

## 2019-07-01 LAB — NOVEL CORONAVIRUS, NAA: SARS-CoV-2, NAA: NOT DETECTED

## 2019-07-27 ENCOUNTER — Other Ambulatory Visit: Payer: Self-pay

## 2019-07-27 DIAGNOSIS — Z20822 Contact with and (suspected) exposure to covid-19: Secondary | ICD-10-CM

## 2019-07-29 LAB — NOVEL CORONAVIRUS, NAA: SARS-CoV-2, NAA: DETECTED — AB

## 2019-07-30 ENCOUNTER — Telehealth: Payer: Self-pay | Admitting: Nurse Practitioner

## 2019-07-30 NOTE — Telephone Encounter (Signed)
Called to Discuss with patient about Covid symptoms and the use of bamlanivimab, a monoclonal antibody infusion for those with mild to moderate Covid symptoms and at a high risk of hospitalization.     Pt is qualified for this infusion at the [facility] infusion center due to co-morbid conditions and/or a member of an at-risk group.    Patient is years old and is managed for hypertension.    Unable to reach pt

## 2021-07-03 ENCOUNTER — Other Ambulatory Visit: Payer: Self-pay

## 2021-07-03 ENCOUNTER — Emergency Department (HOSPITAL_BASED_OUTPATIENT_CLINIC_OR_DEPARTMENT_OTHER): Payer: Medicare Other

## 2021-07-03 ENCOUNTER — Encounter (HOSPITAL_BASED_OUTPATIENT_CLINIC_OR_DEPARTMENT_OTHER): Payer: Self-pay | Admitting: *Deleted

## 2021-07-03 ENCOUNTER — Inpatient Hospital Stay (HOSPITAL_BASED_OUTPATIENT_CLINIC_OR_DEPARTMENT_OTHER)
Admission: EM | Admit: 2021-07-03 | Discharge: 2021-07-06 | DRG: 389 | Disposition: A | Discharge status: Home / Self Care | Payer: Medicare Other | Attending: Internal Medicine | Admitting: Internal Medicine

## 2021-07-03 DIAGNOSIS — I10 Essential (primary) hypertension: Secondary | ICD-10-CM | POA: Diagnosis present

## 2021-07-03 DIAGNOSIS — K56600 Partial intestinal obstruction, unspecified as to cause: Secondary | ICD-10-CM | POA: Diagnosis present

## 2021-07-03 DIAGNOSIS — E860 Dehydration: Secondary | ICD-10-CM | POA: Diagnosis present

## 2021-07-03 DIAGNOSIS — Z7982 Long term (current) use of aspirin: Secondary | ICD-10-CM

## 2021-07-03 DIAGNOSIS — F32A Depression, unspecified: Secondary | ICD-10-CM | POA: Diagnosis present

## 2021-07-03 DIAGNOSIS — F419 Anxiety disorder, unspecified: Secondary | ICD-10-CM | POA: Diagnosis present

## 2021-07-03 DIAGNOSIS — E785 Hyperlipidemia, unspecified: Secondary | ICD-10-CM | POA: Diagnosis present

## 2021-07-03 DIAGNOSIS — Z9049 Acquired absence of other specified parts of digestive tract: Secondary | ICD-10-CM

## 2021-07-03 DIAGNOSIS — N179 Acute kidney failure, unspecified: Secondary | ICD-10-CM

## 2021-07-03 DIAGNOSIS — K5651 Intestinal adhesions [bands], with partial obstruction: Principal | ICD-10-CM | POA: Diagnosis present

## 2021-07-03 DIAGNOSIS — Z79899 Other long term (current) drug therapy: Secondary | ICD-10-CM

## 2021-07-03 DIAGNOSIS — R112 Nausea with vomiting, unspecified: Secondary | ICD-10-CM | POA: Diagnosis present

## 2021-07-03 DIAGNOSIS — K56609 Unspecified intestinal obstruction, unspecified as to partial versus complete obstruction: Principal | ICD-10-CM

## 2021-07-03 DIAGNOSIS — E876 Hypokalemia: Secondary | ICD-10-CM | POA: Diagnosis present

## 2021-07-03 DIAGNOSIS — Z0189 Encounter for other specified special examinations: Secondary | ICD-10-CM

## 2021-07-03 DIAGNOSIS — Z833 Family history of diabetes mellitus: Secondary | ICD-10-CM

## 2021-07-03 DIAGNOSIS — Z90710 Acquired absence of both cervix and uterus: Secondary | ICD-10-CM

## 2021-07-03 DIAGNOSIS — Z20822 Contact with and (suspected) exposure to covid-19: Secondary | ICD-10-CM | POA: Diagnosis present

## 2021-07-03 HISTORY — DX: Hyperlipidemia, unspecified: E78.5

## 2021-07-03 LAB — COMPREHENSIVE METABOLIC PANEL
ALT: 17 U/L (ref 0–44)
AST: 26 U/L (ref 15–41)
Albumin: 4.6 g/dL (ref 3.5–5.0)
Alkaline Phosphatase: 52 U/L (ref 38–126)
Anion gap: 17 — ABNORMAL HIGH (ref 5–15)
BUN: 37 mg/dL — ABNORMAL HIGH (ref 8–23)
CO2: 27 mmol/L (ref 22–32)
Calcium: 9.8 mg/dL (ref 8.9–10.3)
Chloride: 91 mmol/L — ABNORMAL LOW (ref 98–111)
Creatinine, Ser: 1.76 mg/dL — ABNORMAL HIGH (ref 0.44–1.00)
GFR, Estimated: 29 mL/min — ABNORMAL LOW (ref >60)
Glucose, Bld: 90 mg/dL (ref 70–99)
Potassium: 3.2 mmol/L — ABNORMAL LOW (ref 3.5–5.1)
Sodium: 135 mmol/L (ref 135–145)
Total Bilirubin: 0.5 mg/dL (ref 0.3–1.2)
Total Protein: 8.4 g/dL — ABNORMAL HIGH (ref 6.5–8.1)

## 2021-07-03 LAB — CBC
HCT: 38.5 % (ref 36.0–46.0)
Hemoglobin: 12.8 g/dL (ref 12.0–15.0)
MCH: 29.6 pg (ref 26.0–34.0)
MCHC: 33.2 g/dL (ref 30.0–36.0)
MCV: 88.9 fL (ref 80.0–100.0)
Platelets: 402 10*3/uL — ABNORMAL HIGH (ref 150–400)
RBC: 4.33 MIL/uL (ref 3.87–5.11)
RDW: 13.2 % (ref 11.5–15.5)
WBC: 13.5 10*3/uL — ABNORMAL HIGH (ref 4.0–10.5)
nRBC: 0 % (ref 0.0–0.2)

## 2021-07-03 LAB — RESP PANEL BY RT-PCR (FLU A&B, COVID) ARPGX2
Influenza A by PCR: NEGATIVE
Influenza B by PCR: NEGATIVE
SARS Coronavirus 2 by RT PCR: NEGATIVE

## 2021-07-03 LAB — LIPASE, BLOOD: Lipase: 50 U/L (ref 11–51)

## 2021-07-03 MED ORDER — MIDAZOLAM HCL 2 MG/2ML IJ SOLN
1.0000 mg | Freq: Once | INTRAMUSCULAR | Status: AC
  Administered 2021-07-03: 1 mg via INTRAVENOUS
  Filled 2021-07-03: qty 2

## 2021-07-03 MED ORDER — LACTATED RINGERS IV SOLN: INTRAVENOUS | Status: DC

## 2021-07-03 MED ORDER — LACTATED RINGERS IV BOLUS
1000.0000 mL | Freq: Once | INTRAVENOUS | Status: AC
  Administered 2021-07-03: 1000 mL via INTRAVENOUS

## 2021-07-03 NOTE — ED Notes (Signed)
Pt gone to CT 

## 2021-07-03 NOTE — ED Provider Notes (Signed)
[facility] EM[facility] DEPT Provider Note   CSN: [id number] Arrival date & time: 07/03/21  1356     History Chief Complaint  Patient presents with   Emesis    Mandy Bowman is a 80 y.o. female.  Presents ER with concern for nausea and vomiting.  For the past week has not been feeling well.  Initially had some diarrhea but then was having bad nausea with vomiting.  Vomiting today has improved but still since little bit nauseous.  Not having regular bowel movements for the last few days.  Has history of hypertension, has had multiple abdominal surgeries.  No fevers or chills.  HPI     Past Medical History:  Diagnosis Date   Hypertension     There are no problems to display for this patient.   Past Surgical History:  Procedure Laterality Date   ABDOMINAL SURGERY     COLON SURGERY     COLOSTOMY REVERSAL       OB History   No obstetric history on file.     No family history on file.  Social History   Tobacco Use   Smoking status: Never  Substance Use Topics   Alcohol use: Yes    Comment: beer   Drug use: Never    Home Medications Prior to Admission medications   Medication Sig Start Date End Date Taking? Authorizing Provider  acetaminophen (TYLENOL) 500 MG tablet Take 1,000 mg by mouth every 6 (six) hours as needed for mild pain or headache.    [provider]  amLODipine (NORVASC) 10 MG tablet Take 10 mg by mouth daily.    [provider]  amoxicillin (AMOXIL) 500 MG capsule Take 1 capsule (500 mg total) by mouth 3 (three) times daily. 10/27/14   Margarita Mail, PA-C  aspirin EC 81 MG tablet Take 81 mg by mouth daily.    [provider]  Calcium Carbonate-Vitamin D (CALTRATE 600+D PO) Take 1 tablet by mouth 2 (two) times daily.    [provider]  diclofenac (VOLTAREN) 75 MG EC tablet Take 75 mg by mouth daily as needed (for pain).    [provider]  HYDROcodone-acetaminophen (NORCO) 5-325 MG per  tablet Take 1-2 tablets by mouth every 6 (six) hours as needed for moderate pain. 10/27/14   Harris, Vernie Shanks, PA-C  losartan-hydrochlorothiazide (HYZAAR) 100-25 MG per tablet Take 1 tablet by mouth daily.    [provider]  Multiple Vitamin (MULTIVITAMIN WITH MINERALS) TABS tablet Take 1 tablet by mouth daily.    [provider]  potassium chloride SA (K-DUR,KLOR-CON) 20 MEQ tablet Take 20 mEq by mouth 2 (two) times daily.    [provider]  pravastatin (PRAVACHOL) 20 MG tablet Take 20 mg by mouth daily.    [provider]  ranitidine (ZANTAC) 150 MG tablet Take 150 mg by mouth 2 (two) times daily as needed for heartburn.    [provider]  Vitamin D, Cholecalciferol, 1000 UNITS CAPS Take 1,000 Units by mouth daily.    [provider]    Allergies    Patient has no known allergies.  Review of Systems   Review of Systems  Constitutional:  Negative for chills and fever.  HENT:  Negative for ear pain and sore throat.   Eyes:  Negative for pain and visual disturbance.  Respiratory:  Negative for cough and shortness of breath.   Cardiovascular:  Negative for chest pain and palpitations.  Gastrointestinal:  Positive for abdominal pain,  diarrhea, nausea and vomiting.  Genitourinary:  Negative for dysuria and hematuria.  Musculoskeletal:  Negative for arthralgias and back pain.  Skin:  Negative for color change and rash.  Neurological:  Negative for seizures and syncope.  All other systems reviewed and are negative.  Physical Exam Updated Vital Signs BP 128/69   Pulse (!) 106   Temp 100 F (37.8 C)   Resp 19   Ht 4\' 10"  (1.473 m)   Wt 49.9 kg   SpO2 98%   BMI 22.99 kg/m   Physical Exam Vitals and nursing note reviewed.  Constitutional:      General: She is not in acute distress.    Appearance: She is well-developed.  HENT:     Head: Normocephalic and atraumatic.  Eyes:     Conjunctiva/sclera: Conjunctivae normal.   Cardiovascular:     Rate and Rhythm: Normal rate and regular rhythm.     Heart sounds: No murmur heard. Pulmonary:     Effort: Pulmonary effort is normal. No respiratory distress.     Breath sounds: Normal breath sounds.  Abdominal:     Palpations: Abdomen is soft.     Tenderness: There is abdominal tenderness.     Comments: Generalized tenderness to palpation but no rebound or guarding  Musculoskeletal:        General: No swelling.     Cervical back: Neck supple.  Skin:    General: Skin is warm and dry.     Capillary Refill: Capillary refill takes less than 2 seconds.  Neurological:     Mental Status: She is alert.  Psychiatric:        Mood and Affect: Mood normal.    ED Results / Procedures / Treatments   Labs (all labs ordered are listed, but only abnormal results are displayed) Labs Reviewed  COMPREHENSIVE METABOLIC PANEL - Abnormal; Notable for the following components:      Result Value   Potassium 3.2 (*)    Chloride 91 (*)    BUN 37 (*)    Creatinine, Ser 1.76 (*)    Total Protein 8.4 (*)    GFR, Estimated 29 (*)    Anion gap 17 (*)    All other components within normal limits  CBC - Abnormal; Notable for the following components:   WBC 13.5 (*)    Platelets 402 (*)    All other components within normal limits  RESP PANEL BY RT-PCR (FLU A&B, COVID) ARPGX2  LIPASE, BLOOD  URINALYSIS, ROUTINE W REFLEX MICROSCOPIC    EKG None  Radiology CT ABDOMEN PELVIS WO CONTRAST  Result Date: 07/03/2021 CLINICAL DATA:  Nausea and vomiting and diarrhea for 1 week, initial encounter EXAM: CT ABDOMEN AND PELVIS WITHOUT CONTRAST TECHNIQUE: Multidetector CT imaging of the abdomen and pelvis was performed following the standard protocol without IV contrast. COMPARISON:  None. FINDINGS: Lower chest: No acute abnormality. Hepatobiliary: Gallbladder is partially distended. Multiple small dependent gallstones are noted without complicating factors. Liver is within normal limits.  Pancreas: Unremarkable. No pancreatic ductal dilatation or surrounding inflammatory changes. Spleen: Normal in size without focal abnormality. Adrenals/Urinary Tract: Adrenal glands are within normal limits. Kidneys are well visualized bilaterally without renal calculi. Vague hypodensity is noted in the midportion of the right kidney measuring 15 mm most consistent with a cyst. No obstructive changes are seen. The bladder is partially distended. Stomach/Bowel: Colon shows no obstructive or inflammatory changes. Postsurgical changes in the right mid abdomen are seen consistent with prior partial right colectomy.  The appendix is not well seen. Dilated proximal small bowel is noted to include the duodenum and proximal jejunum. A definitive transition zone is not delineated although relative caliber change is noted in the mid left abdomen intimately apposed to the anterior aspect of the left abdominal wall. These changes suggest underlying adhesions. The more distal small bowel appears within normal limits. Stomach is unremarkable. Vascular/Lymphatic: Aortic atherosclerosis. No enlarged abdominal or pelvic lymph nodes. Reproductive: Status post hysterectomy. No adnexal masses. Other: No abdominal wall hernia or abnormality. No abdominopelvic ascites. Postsurgical changes are noted in the anterior abdominal wall. Musculoskeletal: No acute or significant osseous findings. IMPRESSION: Partial small bowel obstruction in the proximal small bowel involving the duodenum and jejunum. Matting of small bowel loops is noted along the anterior aspect of the left abdominal wall likely related to adhesions and contributing to the obstructive change. Right renal cyst. Cholelithiasis without complicating factors. Electronically Signed   By: Alcide Clever M.D.   On: 07/03/2021 20:50   DG Abdomen 1 View  Result Date: 07/03/2021 CLINICAL DATA:  NG placement. EXAM: ABDOMEN - 1 VIEW COMPARISON:  CT of the abdomen pelvis dated  07/03/2021. FINDINGS: Enteric tube with tip and side-port in the left upper abdomen likely in the gastric fundus. There are bibasilar atelectasis. No focal consolidation, pleural effusion, pneumothorax. The cardiac silhouette is within normal limits. Atherosclerotic calcification of the aorta as well as calcification of the mitral annulus. No acute osseous pathology. Degenerative changes of the spine. IMPRESSION: Enteric tube with tip and side-port in the gastric fundus. Electronically Signed   By: Elgie Collard M.D.   On: 07/03/2021 22:45    Procedures Procedures   Medications Ordered in ED Medications  lactated ringers bolus 1,000 mL (0 mLs Intravenous Stopped 07/03/21 2203)  midazolam (VERSED) injection 1 mg (1 mg Intravenous Given 07/03/21 2203)    ED Course  I have reviewed the triage vital signs and the nursing notes.  Pertinent labs & imaging results that were available during my care of the patient were reviewed by me and considered in my medical decision making (see chart for details).    MDM Rules/Calculators/A&P                          80 year old lady presents to ER with concern for nausea, vomiting.  Has extensive abdominal surgical history.  Labs noted for acute kidney injury.  CT scan concerning for partial small bowel obstruction.  Discussed case with Dr. Cliffton Asters, he recommends medicine admission, NG tube, SBO protocol.  NG tube placed successfully.  Consulted [facility] for admit.  Final Clinical Impression(s) / ED Diagnoses Final diagnoses:  Small bowel obstruction (HCC)  AKI (acute kidney injury) (HCC)    Rx / DC Orders ED Discharge Orders     None        Milagros Loll, MD 07/03/21 2255

## 2021-07-03 NOTE — ED Triage Notes (Addendum)
N/v x 1 week- Had diarrhea initially but now has not been able to have a BM. Pt reports ~ 3 episodes of emesis today

## 2021-07-04 ENCOUNTER — Encounter (HOSPITAL_COMMUNITY): Payer: Self-pay | Admitting: Internal Medicine

## 2021-07-04 ENCOUNTER — Inpatient Hospital Stay (HOSPITAL_COMMUNITY): Payer: Medicare Other

## 2021-07-04 DIAGNOSIS — N179 Acute kidney failure, unspecified: Secondary | ICD-10-CM | POA: Diagnosis present

## 2021-07-04 DIAGNOSIS — Z833 Family history of diabetes mellitus: Secondary | ICD-10-CM | POA: Diagnosis not present

## 2021-07-04 DIAGNOSIS — Z79899 Other long term (current) drug therapy: Secondary | ICD-10-CM | POA: Diagnosis not present

## 2021-07-04 DIAGNOSIS — I10 Essential (primary) hypertension: Secondary | ICD-10-CM | POA: Diagnosis present

## 2021-07-04 DIAGNOSIS — R112 Nausea with vomiting, unspecified: Secondary | ICD-10-CM | POA: Diagnosis present

## 2021-07-04 DIAGNOSIS — E785 Hyperlipidemia, unspecified: Secondary | ICD-10-CM | POA: Diagnosis present

## 2021-07-04 DIAGNOSIS — K56600 Partial intestinal obstruction, unspecified as to cause: Secondary | ICD-10-CM

## 2021-07-04 DIAGNOSIS — Z7982 Long term (current) use of aspirin: Secondary | ICD-10-CM | POA: Diagnosis not present

## 2021-07-04 DIAGNOSIS — E860 Dehydration: Secondary | ICD-10-CM | POA: Diagnosis present

## 2021-07-04 DIAGNOSIS — F32A Depression, unspecified: Secondary | ICD-10-CM | POA: Diagnosis present

## 2021-07-04 DIAGNOSIS — K5651 Intestinal adhesions [bands], with partial obstruction: Secondary | ICD-10-CM | POA: Diagnosis present

## 2021-07-04 DIAGNOSIS — Z9049 Acquired absence of other specified parts of digestive tract: Secondary | ICD-10-CM | POA: Diagnosis not present

## 2021-07-04 DIAGNOSIS — Z20822 Contact with and (suspected) exposure to covid-19: Secondary | ICD-10-CM | POA: Diagnosis present

## 2021-07-04 DIAGNOSIS — F419 Anxiety disorder, unspecified: Secondary | ICD-10-CM | POA: Diagnosis present

## 2021-07-04 DIAGNOSIS — Z90710 Acquired absence of both cervix and uterus: Secondary | ICD-10-CM | POA: Diagnosis not present

## 2021-07-04 DIAGNOSIS — E876 Hypokalemia: Secondary | ICD-10-CM | POA: Diagnosis present

## 2021-07-04 LAB — BASIC METABOLIC PANEL
Anion gap: 17 — ABNORMAL HIGH (ref 5–15)
BUN: 36 mg/dL — ABNORMAL HIGH (ref 8–23)
CO2: 21 mmol/L — ABNORMAL LOW (ref 22–32)
Calcium: 8.6 mg/dL — ABNORMAL LOW (ref 8.9–10.3)
Chloride: 97 mmol/L — ABNORMAL LOW (ref 98–111)
Creatinine, Ser: 1.22 mg/dL — ABNORMAL HIGH (ref 0.44–1.00)
GFR, Estimated: 45 mL/min — ABNORMAL LOW (ref >60)
Glucose, Bld: 72 mg/dL (ref 70–99)
Potassium: 2.9 mmol/L — ABNORMAL LOW (ref 3.5–5.1)
Sodium: 135 mmol/L (ref 135–145)

## 2021-07-04 LAB — CBC
HCT: 34.9 % — ABNORMAL LOW (ref 36.0–46.0)
Hemoglobin: 11.6 g/dL — ABNORMAL LOW (ref 12.0–15.0)
MCH: 29.9 pg (ref 26.0–34.0)
MCHC: 33.2 g/dL (ref 30.0–36.0)
MCV: 89.9 fL (ref 80.0–100.0)
Platelets: 326 10*3/uL (ref 150–400)
RBC: 3.88 MIL/uL (ref 3.87–5.11)
RDW: 13.4 % (ref 11.5–15.5)
WBC: 9.8 10*3/uL (ref 4.0–10.5)
nRBC: 0 % (ref 0.0–0.2)

## 2021-07-04 LAB — LACTIC ACID, PLASMA
Lactic Acid, Venous: 1 mmol/L (ref 0.5–1.9)
Lactic Acid, Venous: 1 mmol/L (ref 0.5–1.9)

## 2021-07-04 LAB — MAGNESIUM: Magnesium: 2 mg/dL (ref 1.7–2.4)

## 2021-07-04 MED ORDER — PRAVASTATIN SODIUM 20 MG PO TABS
20.0000 mg | ORAL_TABLET | Freq: Every day | ORAL | Status: DC
  Administered 2021-07-05 – 2021-07-06 (×2): 20 mg via ORAL
  Filled 2021-07-04 (×2): qty 1

## 2021-07-04 MED ORDER — POTASSIUM CHLORIDE 10 MEQ/100ML IV SOLN
10.0000 meq | INTRAVENOUS | Status: AC
  Administered 2021-07-04 (×3): 10 meq via INTRAVENOUS
  Filled 2021-07-04 (×3): qty 100

## 2021-07-04 MED ORDER — TRAMADOL HCL 50 MG PO TABS
50.0000 mg | ORAL_TABLET | Freq: Four times a day (QID) | ORAL | Status: DC
  Filled 2021-07-04: qty 1

## 2021-07-04 MED ORDER — DULOXETINE HCL 30 MG PO CPEP
30.0000 mg | ORAL_CAPSULE | Freq: Every day | ORAL | Status: DC
  Administered 2021-07-05 – 2021-07-06 (×2): 30 mg via ORAL
  Filled 2021-07-04 (×2): qty 1

## 2021-07-04 MED ORDER — ENOXAPARIN SODIUM 30 MG/0.3ML IJ SOSY
30.0000 mg | PREFILLED_SYRINGE | INTRAMUSCULAR | Status: DC
  Administered 2021-07-04 – 2021-07-05 (×2): 30 mg via SUBCUTANEOUS
  Filled 2021-07-04 (×2): qty 0.3

## 2021-07-04 MED ORDER — ACETAMINOPHEN 650 MG RE SUPP: 650.0000 mg | Freq: Four times a day (QID) | RECTAL | Status: DC | PRN

## 2021-07-04 MED ORDER — OXYCODONE HCL 5 MG PO TABS
5.0000 mg | ORAL_TABLET | Freq: Four times a day (QID) | ORAL | Status: DC | PRN
  Administered 2021-07-04: 5 mg via ORAL
  Filled 2021-07-04: qty 1

## 2021-07-04 MED ORDER — ONDANSETRON HCL 4 MG/2ML IJ SOLN: 4.0000 mg | Freq: Four times a day (QID) | INTRAMUSCULAR | Status: DC | PRN

## 2021-07-04 MED ORDER — ACETAMINOPHEN 325 MG PO TABS
650.0000 mg | ORAL_TABLET | Freq: Four times a day (QID) | ORAL | Status: DC | PRN
  Administered 2021-07-05 (×2): 650 mg via ORAL
  Filled 2021-07-04 (×2): qty 2

## 2021-07-04 MED ORDER — AMLODIPINE BESYLATE 10 MG PO TABS
10.0000 mg | ORAL_TABLET | Freq: Every day | ORAL | Status: DC
  Administered 2021-07-05 – 2021-07-06 (×2): 10 mg via ORAL
  Filled 2021-07-04 (×2): qty 1

## 2021-07-04 MED ORDER — ONDANSETRON HCL 4 MG PO TABS: 4.0000 mg | ORAL_TABLET | Freq: Four times a day (QID) | ORAL | Status: DC | PRN

## 2021-07-04 MED ORDER — POTASSIUM CHLORIDE 20 MEQ PO PACK
20.0000 meq | PACK | Freq: Once | ORAL | Status: AC
  Administered 2021-07-05: 20 meq via ORAL
  Filled 2021-07-04: qty 1

## 2021-07-04 MED ORDER — POTASSIUM CHLORIDE 10 MEQ/100ML IV SOLN
10.0000 meq | INTRAVENOUS | Status: AC
  Filled 2021-07-04 (×3): qty 100

## 2021-07-04 MED ORDER — PHENOL 1.4 % MT LIQD: 1.0000 | OROMUCOSAL | Status: DC | PRN

## 2021-07-04 MED ORDER — BENZOCAINE 20 % MT AERO
INHALATION_SPRAY | Freq: Four times a day (QID) | OROMUCOSAL | Status: DC | PRN
  Filled 2021-07-04: qty 57

## 2021-07-04 MED ORDER — DIATRIZOATE MEGLUMINE & SODIUM 66-10 % PO SOLN
90.0000 mL | Freq: Once | ORAL | Status: AC
  Administered 2021-07-04: 90 mL via NASOGASTRIC
  Filled 2021-07-04: qty 90

## 2021-07-04 NOTE — H&P (Signed)
History and Physical    Mandy Bowman DOB: [birth year] DOA: 07/03/2021  PCP: Macy Mis, MD  Patient coming from: Home  I have personally briefly reviewed patient's old medical records in [facility]  Chief Complaint: N/V  HPI: Mandy Bowman is a 80 y.o. female with medical history significant of Abd surgery, HTN.  Pt presents to ED at [facility] with c/o N/V.  Initially had diarrhea over the past week, then stopped having regular BMs for past few days.  Onset of N/V over past day or two.  Vomiting improved today though still with nausea.  No fevers / chills.   ED Course: Has PSBO.  Creat 1.7 today is up from 1.03 in Sept (at [facility])   Review of Systems: As per HPI, otherwise all review of systems negative.  Past Medical History:  Diagnosis Date   Hypertension     Past Surgical History:  Procedure Laterality Date   ABDOMINAL SURGERY     COLON SURGERY     COLOSTOMY REVERSAL       reports that she has never smoked. She does not have any smokeless tobacco history on file. She reports current alcohol use. She reports that she does not use drugs.  No Known Allergies  Family History  Problem Relation Age of Onset   Diabetes Father      Prior to Admission medications   Medication Sig Start Date End Date Taking? Authorizing Provider  acetaminophen (TYLENOL) 500 MG tablet Take 1,000 mg by mouth every 6 (six) hours as needed for mild pain or headache.   Yes [provider]  amLODipine (NORVASC) 10 MG tablet Take 10 mg by mouth daily.   Yes [provider]  aspirin EC 81 MG tablet Take 81 mg by mouth daily.   Yes [provider]  Calcium Carbonate-Vitamin D (CALTRATE 600+D PO) Take 1 tablet by mouth 2 (two) times daily.   Yes [provider]  diclofenac (VOLTAREN) 75 MG EC tablet Take 75 mg by mouth daily as needed (for pain).   Yes [provider]  amoxicillin (AMOXIL) 500 MG capsule Take 1 capsule (500 mg  total) by mouth 3 (three) times daily. Patient not taking: Reported on 07/04/2021 10/27/14   Arthor Captain, PA-C  hydrochlorothiazide (HYDRODIURIL) 25 MG tablet Take 25 mg by mouth every morning. 04/19/21   [provider]  HYDROcodone-acetaminophen (NORCO) 5-325 MG per tablet Take 1-2 tablets by mouth every 6 (six) hours as needed for moderate pain. Patient not taking: Reported on 07/04/2021 10/27/14   Arthor Captain, PA-C  losartan (COZAAR) 100 MG tablet Take 100 mg by mouth daily. 05/28/21   [provider]  losartan-hydrochlorothiazide (HYZAAR) 100-25 MG per tablet Take 1 tablet by mouth daily.    [provider]  Multiple Vitamin (MULTIVITAMIN WITH MINERALS) TABS tablet Take 1 tablet by mouth daily.    [provider]  potassium chloride SA (K-DUR,KLOR-CON) 20 MEQ tablet Take 20 mEq by mouth 2 (two) times daily.    [provider]  pravastatin (PRAVACHOL) 20 MG tablet Take 20 mg by mouth daily.    [provider]  ranitidine (ZANTAC) 150 MG tablet Take 150 mg by mouth 2 (two) times daily as needed for heartburn.    [provider]  Vitamin D, Cholecalciferol, 1000 UNITS CAPS Take 1,000 Units by mouth daily.    [provider]    Physical Exam: Vitals:   07/03/21 2200 07/03/21 2230 07/03/21 2300 07/04/21  0030  BP: 135/70 128/69 121/79 124/64  Pulse: 97 (!) 106 (!) 101 100  Resp: 17 19 19 18   Temp:      SpO2: 100% 98% 100% 100%  Weight:      Height:        Constitutional: NAD, calm, comfortable Eyes: PERRL, lids and conjunctivae normal ENMT: Mucous membranes are moist. Posterior pharynx clear of any exudate or lesions.Normal dentition.  Neck: normal, supple, no masses, no thyromegaly Respiratory: clear to auscultation bilaterally, no wheezing, no crackles. Normal respiratory effort. No accessory muscle use.  Cardiovascular: Regular rate and rhythm, no murmurs / rubs / gallops. No extremity edema. 2+ pedal pulses.  No carotid bruits.  Abdomen: Soft, NT, ND, no rebound Musculoskeletal: no clubbing / cyanosis. No joint deformity upper and lower extremities. Good ROM, no contractures. Normal muscle tone.  Skin: no rashes, lesions, ulcers. No induration Neurologic: CN 2-12 grossly intact. Sensation intact, DTR normal. Strength 5/5 in all 4.  Psychiatric: Normal judgment and insight. Alert and oriented x 3. Normal mood.    Labs on Admission: I have personally reviewed following labs and imaging studies  CBC: Recent Labs  Lab 07/03/21 1555  WBC 13.5*  HGB 12.8  HCT 38.5  MCV 88.9  PLT 402*   Basic Metabolic Panel: Recent Labs  Lab 07/03/21 1555  NA 135  K 3.2*  CL 91*  CO2 27  GLUCOSE 90  BUN 37*  CREATININE 1.76*  CALCIUM 9.8   GFR: Estimated Creatinine Clearance: 17.9 mL/min (A) (by C-G formula based on SCr of 1.76 mg/dL (H)). Liver Function Tests: Recent Labs  Lab 07/03/21 1555  AST 26  ALT 17  ALKPHOS 52  BILITOT 0.5  PROT 8.4*  ALBUMIN 4.6   Recent Labs  Lab 07/03/21 1555  LIPASE 50   No results for input(s): AMMONIA in the last 168 hours. Coagulation Profile: No results for input(s): INR, PROTIME in the last 168 hours. Cardiac Enzymes: No results for input(s): CKTOTAL, CKMB, CKMBINDEX, TROPONINI in the last 168 hours. BNP (last 3 results) No results for input(s): PROBNP in the last 8760 hours. HbA1C: No results for input(s): HGBA1C in the last 72 hours. CBG: No results for input(s): GLUCAP in the last 168 hours. Lipid Profile: No results for input(s): CHOL, HDL, LDLCALC, TRIG, CHOLHDL, LDLDIRECT in the last 72 hours. Thyroid Function Tests: No results for input(s): TSH, T4TOTAL, FREET4, T3FREE, THYROIDAB in the last 72 hours. Anemia Panel: No results for input(s): VITAMINB12, FOLATE, FERRITIN, TIBC, IRON, RETICCTPCT in the last 72 hours. Urine analysis:    Component Value Date/Time   COLORURINE YELLOW 01/26/2008 1622   APPEARANCEUR HAZY (A) 01/26/2008 1622    LABSPEC 1.018 01/26/2008 1622   PHURINE 6.5 01/26/2008 1622   GLUCOSEU NEGATIVE 01/26/2008 1622   HGBUR NEGATIVE 01/26/2008 1622   BILIRUBINUR NEGATIVE 01/26/2008 1622   KETONESUR NEGATIVE 01/26/2008 1622   PROTEINUR NEGATIVE 01/26/2008 1622   UROBILINOGEN 0.2 01/26/2008 1622   NITRITE NEGATIVE 01/26/2008 1622   LEUKOCYTESUR  01/26/2008 1622    NEGATIVE MICROSCOPIC NOT DONE ON URINES WITH NEGATIVE PROTEIN, BLOOD, LEUKOCYTES, NITRITE, OR GLUCOSE <1000 mg/dL.    Radiological Exams on Admission: CT ABDOMEN PELVIS WO CONTRAST  Result Date: 07/03/2021 CLINICAL DATA:  Nausea and vomiting and diarrhea for 1 week, initial encounter EXAM: CT ABDOMEN AND PELVIS WITHOUT CONTRAST TECHNIQUE: Multidetector CT imaging of the abdomen and pelvis was performed following the standard protocol without IV contrast. COMPARISON:  None. FINDINGS: Lower chest: No acute abnormality.  Hepatobiliary: Gallbladder is partially distended. Multiple small dependent gallstones are noted without complicating factors. Liver is within normal limits. Pancreas: Unremarkable. No pancreatic ductal dilatation or surrounding inflammatory changes. Spleen: Normal in size without focal abnormality. Adrenals/Urinary Tract: Adrenal glands are within normal limits. Kidneys are well visualized bilaterally without renal calculi. Vague hypodensity is noted in the midportion of the right kidney measuring 15 mm most consistent with a cyst. No obstructive changes are seen. The bladder is partially distended. Stomach/Bowel: Colon shows no obstructive or inflammatory changes. Postsurgical changes in the right mid abdomen are seen consistent with prior partial right colectomy. The appendix is not well seen. Dilated proximal small bowel is noted to include the duodenum and proximal jejunum. A definitive transition zone is not delineated although relative caliber change is noted in the mid left abdomen intimately apposed to the anterior aspect of the left  abdominal wall. These changes suggest underlying adhesions. The more distal small bowel appears within normal limits. Stomach is unremarkable. Vascular/Lymphatic: Aortic atherosclerosis. No enlarged abdominal or pelvic lymph nodes. Reproductive: Status post hysterectomy. No adnexal masses. Other: No abdominal wall hernia or abnormality. No abdominopelvic ascites. Postsurgical changes are noted in the anterior abdominal wall. Musculoskeletal: No acute or significant osseous findings. IMPRESSION: Partial small bowel obstruction in the proximal small bowel involving the duodenum and jejunum. Matting of small bowel loops is noted along the anterior aspect of the left abdominal wall likely related to adhesions and contributing to the obstructive change. Right renal cyst. Cholelithiasis without complicating factors. Electronically Signed   By: Alcide Clever M.D.   On: 07/03/2021 20:50   DG Abdomen 1 View  Result Date: 07/03/2021 CLINICAL DATA:  NG placement. EXAM: ABDOMEN - 1 VIEW COMPARISON:  CT of the abdomen pelvis dated 07/03/2021. FINDINGS: Enteric tube with tip and side-port in the left upper abdomen likely in the gastric fundus. There are bibasilar atelectasis. No focal consolidation, pleural effusion, pneumothorax. The cardiac silhouette is within normal limits. Atherosclerotic calcification of the aorta as well as calcification of the mitral annulus. No acute osseous pathology. Degenerative changes of the spine. IMPRESSION: Enteric tube with tip and side-port in the gastric fundus. Electronically Signed   By: Elgie Collard M.D.   On: 07/03/2021 22:45    EKG: Independently reviewed.  Assessment/Plan Principal Problem:   Partial small bowel obstruction (HCC) Active Problems:   AKI (acute kidney injury) (HCC)   HTN (hypertension)    PSBO - NPO IVF: 1L bolus and LR at 100 Zofran PRN EDP spoke with gen surg who will consult AKI - Suspect pre-renal due to dehydration IVF Strict intake and  output Repeat BMP in AM HTN - Med rec pending Hold diuretics and ARB in setting of AKI Continue other BP meds  DVT prophylaxis: Lovenox Code Status: Full Family Communication: Family at bedside Disposition Plan: Home after SBO resolved Consults called: EDP spoke with Dr. Cliffton Asters Admission status: Admit to inpatient  Severity of Illness: The appropriate patient status for this patient is INPATIENT. Inpatient status is judged to be reasonable and necessary in order to provide the required intensity of service to ensure the patient's safety. The patient's presenting symptoms, physical exam findings, and initial radiographic and laboratory data in the context of their chronic comorbidities is felt to place them at high risk for further clinical deterioration. Furthermore, it is not anticipated that the patient will be medically stable for discharge from the hospital within 2 midnights of admission.   * I certify that at the  point of admission it is my clinical judgment that the patient will require inpatient hospital care spanning beyond 2 midnights from the point of admission due to high intensity of service, high risk for further deterioration and high frequency of surveillance required.*   Alisa Stjames M. DO [facility]  How to contact the [facility] Attending or Consulting provider 7A - 7P or covering provider during after hours 7P -7A, for this patient?  Check the care team in [facility] and look for a) attending/consulting [facility] provider listed and b) the [facility] team listed Log into [email or web address]  [employer] Physician Scheduling and messaging for groups and whole hospitals  On call and physician scheduling software for group practices, residents, hospitalists and other medical providers for call, clinic, rotation and shift schedules. On[employer] is a hospital-wide system for scheduling doctors and paging doctors on call. EasyPlot is for scientific plotting and data analysis.  [email or web address]  and  use [facility] universal password to access. If you do not have the password, please contact the hospital operator.  Locate the [facility] provider you are looking for under [facility] and page to a number that you can be directly reached. If you still have difficulty reaching the provider, please page the [facility] (Director on Call) for the Hospitalists listed on amion for assistance.  07/04/2021, 1:46 AM

## 2021-07-04 NOTE — ED Notes (Signed)
Called [facility] to transport patient to [facility] 4W room# 1445 °

## 2021-07-04 NOTE — ED Notes (Signed)
Attempted to call report to RN. RN unavailable at this time. Left name and number for RN to return phone call.

## 2021-07-04 NOTE — ED Notes (Signed)
[facility] here to transport pt 

## 2021-07-04 NOTE — Progress Notes (Signed)
Mandy Bowman is a 80 y.o. female with medical history significant of Abd surgery, HTN.   Pt presents to ED at [facility] with c/o N/V.  Initially had diarrhea over the past week, then stopped having regular BMs for past few days.  Onset of N/V over past day or two.  Vomiting improved today though still with nausea.   No fevers / chills.     ED Course: Has PSBO.   Creat 1.7 today is up from 1.03 in Sept (at [facility])  Patient was admitted this morning by one of my partners Dr. Julian Reil.  Please refer to his H&P for further details of the assessment and plan.  07/04/21: Patient was seen and examined at her bedside.  Her daughter was present in the room.  She denies having any nausea or abdominal pain at the time of this exam.  NG tube in place with 300 cc of bile fluid in the collector.  Potassium repleted intravenously, with goal potassium level greater than 4.0.  Serum magnesium added, goal level greater than 2.0.  On lactated Ringer at 100 cc/h.  Patient advised to mobilize as tolerated, she was receptive.

## 2021-07-04 NOTE — Consult Note (Signed)
Reason for Consult: SBO possible ileus Referring Physician: [name] is an 80 y.o. female.  HPI: Patient is an 80 year old female with a history of colon resection, colostomy and reversal.  This was remotely.  Patient comes in secondary to worsening with history of dilated, recent nausea vomiting of the last 3 to 4 days.  Patient states that her abdomen feels better after NG tube placement at the outside ER.  Patient had CT scan with signs consistent with ileus versus SBO.  Patient was transferred to possible hospital for further evaluation and management. I did review the CT scan personally patient also has hypokalemia, elevated creatinine likely due to dehydration.  Patient with no leukocytosis.  Past Medical History:  Diagnosis Date   HLD (hyperlipidemia)    Hypertension     Past Surgical History:  Procedure Laterality Date   ABDOMINAL SURGERY     COLON SURGERY     COLOSTOMY REVERSAL      Family History  Problem Relation Age of Onset   Diabetes Father     Social History:  reports that she has never smoked. She does not have any smokeless tobacco history on file. She reports current alcohol use. She reports that she does not use drugs.  Allergies: No Known Allergies  Medications: I have reviewed the patient's current medications.  Results for orders placed or performed during the hospital encounter of 07/03/21 (from the past 48 hour(s))  Lipase, blood     Status: None   Collection Time: 07/03/21  3:55 PM  Result Value Ref Range   Lipase 50 11 - 51 U/L    Comment: Performed at [facility], [address], [place], [place] [address]  Comprehensive metabolic panel     Status: Abnormal   Collection Time: 07/03/21  3:55 PM  Result Value Ref Range   Sodium 135 135 - 145 mmol/L   Potassium 3.2 (L) 3.5 - 5.1 mmol/L   Chloride 91 (L) 98 - 111 mmol/L   CO2 27 22 - 32 mmol/L   Glucose, Bld 90 70 - 99 mg/dL    Comment: Glucose reference range  applies only to samples taken after fasting for at least 8 hours.   BUN 37 (H) 8 - 23 mg/dL   Creatinine, Ser 0.73 (H) 0.44 - 1.00 mg/dL   Calcium 9.8 8.9 - 71.0 mg/dL   Total Protein 8.4 (H) 6.5 - 8.1 g/dL   Albumin 4.6 3.5 - 5.0 g/dL   AST 26 15 - 41 U/L   ALT 17 0 - 44 U/L   Alkaline Phosphatase 52 38 - 126 U/L   Total Bilirubin 0.5 0.3 - 1.2 mg/dL   GFR, Estimated 29 (L) >60 mL/min    Comment: (NOTE) Calculated using the CKD-EPI Creatinine Equation (2021)    Anion gap 17 (H) 5 - 15    Comment: Performed at [facility], [address] [place], [place] [address]  CBC     Status: Abnormal   Collection Time: 07/03/21  3:55 PM  Result Value Ref Range   WBC 13.5 (H) 4.0 - 10.5 K/uL   RBC 4.33 3.87 - 5.11 MIL/uL   Hemoglobin 12.8 12.0 - 15.0 g/dL   HCT 85.4 62.7 - 03.5 %   MCV 88.9 80.0 - 100.0 fL   MCH 29.6 26.0 - 34.0 pg   MCHC 33.2 30.0 - 36.0 g/dL   RDW 00.9 38.1 - 82.9 %   Platelets 402 (H) 150 - 400  K/uL   nRBC 0.0 0.0 - 0.2 %    Comment: Performed at [facility], [address], [place], [place] [address]  Resp Panel by RT-PCR (Flu A&B, Covid) Nasopharyngeal Swab     Status: None   Collection Time: 07/03/21  8:07 PM   Specimen: Nasopharyngeal Swab; Nasopharyngeal(NP) swabs in vial transport medium  Result Value Ref Range   SARS Coronavirus 2 by RT PCR NEGATIVE NEGATIVE    Comment: (NOTE) SARS-CoV-2 target nucleic acids are NOT DETECTED.  The SARS-CoV-2 RNA is generally detectable in upper respiratory specimens during the acute phase of infection. The lowest concentration of SARS-CoV-2 viral copies this assay can detect is 138 copies/mL. A negative result does not preclude SARS-Cov-2 infection and should not be used as the sole basis for treatment or other patient management decisions. A negative result may occur with  improper specimen collection/handling, submission of specimen other than nasopharyngeal swab, presence of  viral mutation(s) within the areas targeted by this assay, and inadequate number of viral copies(<138 copies/mL). A negative result must be combined with clinical observations, patient history, and epidemiological information. The expected result is Negative.  Fact Sheet for Patients:  BloggerCourse.com  Fact Sheet for Healthcare Providers:  SeriousBroker.it  This test is no t yet approved or cleared by the [place] FDA and  has been authorized for detection and/or diagnosis of SARS-CoV-2 by FDA under an Emergency Use Authorization (EUA). This EUA will remain  in effect (meaning this test can be used) for the duration of the COVID-19 declaration under Section 564(b)(1) of the Act, 21 U.S.C.section 360bbb-3(b)(1), unless the authorization is terminated  or revoked sooner.       Influenza A by PCR NEGATIVE NEGATIVE   Influenza B by PCR NEGATIVE NEGATIVE    Comment: (NOTE) The Xpert Xpress SARS-CoV-2/FLU/RSV plus assay is intended as an aid in the diagnosis of influenza from Nasopharyngeal swab specimens and should not be used as a sole basis for treatment. Nasal washings and aspirates are unacceptable for Xpert Xpress SARS-CoV-2/FLU/RSV testing.  Fact Sheet for Patients: BloggerCourse.com  Fact Sheet for Healthcare Providers: SeriousBroker.it  This test is not yet approved or cleared by the [place] FDA and has been authorized for detection and/or diagnosis of SARS-CoV-2 by FDA under an Emergency Use Authorization (EUA). This EUA will remain in effect (meaning this test can be used) for the duration of the COVID-19 declaration under Section 564(b)(1) of the Act, 21 U.S.C. section 360bbb-3(b)(1), unless the authorization is terminated or revoked.  Performed at [facility], [address] [place], [place] [address]   Lactic acid, plasma      Status: None   Collection Time: 07/04/21  2:40 AM  Result Value Ref Range   Lactic Acid, Venous 1.0 0.5 - 1.9 mmol/L    Comment: Performed at [facility], [address], [place], [place] [address]  Lactic acid, plasma     Status: None   Collection Time: 07/04/21  6:14 AM  Result Value Ref Range   Lactic Acid, Venous 1.0 0.5 - 1.9 mmol/L    Comment: Performed at [facility], [address], [place], [place] [address]  CBC     Status: Abnormal   Collection Time: 07/04/21  6:14 AM  Result Value Ref Range   WBC 9.8 4.0 - 10.5 K/uL   RBC 3.88 3.87 - 5.11 MIL/uL   Hemoglobin 11.6 (L) 12.0 - 15.0 g/dL   HCT 01.6 (L) 55.3 -  46.0 %   MCV 89.9 80.0 - 100.0 fL   MCH 29.9 26.0 - 34.0 pg   MCHC 33.2 30.0 - 36.0 g/dL   RDW 00.3 70.4 - 88.8 %   Platelets 326 150 - 400 K/uL   nRBC 0.0 0.0 - 0.2 %    Comment: Performed at [facility], [address], [place], [place] [address]  Basic metabolic panel     Status: Abnormal   Collection Time: 07/04/21  6:14 AM  Result Value Ref Range   Sodium 135 135 - 145 mmol/L   Potassium 2.9 (L) 3.5 - 5.1 mmol/L   Chloride 97 (L) 98 - 111 mmol/L   CO2 21 (L) 22 - 32 mmol/L   Glucose, Bld 72 70 - 99 mg/dL    Comment: Glucose reference range applies only to samples taken after fasting for at least 8 hours.   BUN 36 (H) 8 - 23 mg/dL   Creatinine, Ser 5.03 (H) 0.44 - 1.00 mg/dL   Calcium 8.6 (L) 8.9 - 10.3 mg/dL   GFR, Estimated 45 (L) >60 mL/min    Comment: (NOTE) Calculated using the CKD-EPI Creatinine Equation (2021)    Anion gap 17 (H) 5 - 15    Comment: Performed at [facility], [address], [place], [place] [address]    CT ABDOMEN PELVIS WO CONTRAST  Result Date: 07/03/2021 CLINICAL DATA:  Nausea and vomiting and diarrhea for 1 week, initial encounter EXAM: CT ABDOMEN AND PELVIS WITHOUT CONTRAST TECHNIQUE: Multidetector CT imaging of the abdomen and pelvis was  performed following the standard protocol without IV contrast. COMPARISON:  None. FINDINGS: Lower chest: No acute abnormality. Hepatobiliary: Gallbladder is partially distended. Multiple small dependent gallstones are noted without complicating factors. Liver is within normal limits. Pancreas: Unremarkable. No pancreatic ductal dilatation or surrounding inflammatory changes. Spleen: Normal in size without focal abnormality. Adrenals/Urinary Tract: Adrenal glands are within normal limits. Kidneys are well visualized bilaterally without renal calculi. Vague hypodensity is noted in the midportion of the right kidney measuring 15 mm most consistent with a cyst. No obstructive changes are seen. The bladder is partially distended. Stomach/Bowel: Colon shows no obstructive or inflammatory changes. Postsurgical changes in the right mid abdomen are seen consistent with prior partial right colectomy. The appendix is not well seen. Dilated proximal small bowel is noted to include the duodenum and proximal jejunum. A definitive transition zone is not delineated although relative caliber change is noted in the mid left abdomen intimately apposed to the anterior aspect of the left abdominal wall. These changes suggest underlying adhesions. The more distal small bowel appears within normal limits. Stomach is unremarkable. Vascular/Lymphatic: Aortic atherosclerosis. No enlarged abdominal or pelvic lymph nodes. Reproductive: Status post hysterectomy. No adnexal masses. Other: No abdominal wall hernia or abnormality. No abdominopelvic ascites. Postsurgical changes are noted in the anterior abdominal wall. Musculoskeletal: No acute or significant osseous findings. IMPRESSION: Partial small bowel obstruction in the proximal small bowel involving the duodenum and jejunum. Matting of small bowel loops is noted along the anterior aspect of the left abdominal wall likely related to adhesions and contributing to the obstructive change.  Right renal cyst. Cholelithiasis without complicating factors. Electronically Signed   By: Alcide Clever M.D.   On: 07/03/2021 20:50   DG Abdomen 1 View  Result Date: 07/03/2021 CLINICAL DATA:  NG placement. EXAM: ABDOMEN - 1 VIEW COMPARISON:  CT of the abdomen pelvis dated 07/03/2021. FINDINGS: Enteric tube with tip and side-port in  the left upper abdomen likely in the gastric fundus. There are bibasilar atelectasis. No focal consolidation, pleural effusion, pneumothorax. The cardiac silhouette is within normal limits. Atherosclerotic calcification of the aorta as well as calcification of the mitral annulus. No acute osseous pathology. Degenerative changes of the spine. IMPRESSION: Enteric tube with tip and side-port in the gastric fundus. Electronically Signed   By: Elgie Collard M.D.   On: 07/03/2021 22:45    Review of Systems  Constitutional:  Negative for chills and fever.  HENT:  Negative for ear discharge, hearing loss and sore throat.   Eyes:  Negative for discharge.  Respiratory:  Negative for cough and shortness of breath.   Cardiovascular:  Negative for chest pain and leg swelling.  Gastrointestinal:  Positive for abdominal pain, diarrhea, nausea and vomiting. Negative for constipation.  Musculoskeletal:  Negative for myalgias and neck pain.  Skin:  Negative for rash.  Allergic/Immunologic: Negative for environmental allergies.  Neurological:  Negative for dizziness and seizures.  Hematological:  Does not bruise/bleed easily.  Psychiatric/Behavioral:  Negative for suicidal ideas.   All other systems reviewed and are negative. Blood pressure 140/74, pulse 96, temperature 98.7 F (37.1 C), temperature source Oral, resp. rate 19, height 4\' 10"  (1.473 m), weight 49.9 kg, SpO2 99 %. Physical Exam Constitutional:      Appearance: She is well-developed.     Comments: Conversant No acute distress  HENT:     Head: Normocephalic and atraumatic.  Eyes:     General: Lids are normal.  No scleral icterus.    Pupils: Pupils are equal, round, and reactive to light.     Comments: Pupils are equal round and reactive No lid lag Moist conjunctiva  Neck:     Thyroid: No thyromegaly.     Trachea: No tracheal tenderness.     Comments: No cervical lymphadenopathy Cardiovascular:     Rate and Rhythm: Normal rate and regular rhythm.     Heart sounds: No murmur heard. Pulmonary:     Effort: Pulmonary effort is normal.     Breath sounds: Normal breath sounds. No wheezing or rales.  Abdominal:     Tenderness: There is abdominal tenderness. There is no guarding or rebound.     Hernia: No hernia is present.  Musculoskeletal:     Cervical back: Normal range of motion and neck supple.  Skin:    General: Skin is warm.     Findings: No rash.     Nails: There is no clubbing.     Comments: Normal skin turgor  Neurological:     Mental Status: She is alert and oriented to person, place, and time.     Comments: Normal gait and station  Psychiatric:        Mood and Affect: Mood normal.        Thought Content: Thought content normal.        Judgment: Judgment normal.     Comments: Appropriate affect    Assessment/Plan: 80 year old female with SBO versus ileus. Hypokalemia AKI likely due to dehydration  Plan:  -replete K+ per medicine -NGT and SBO protocol -No need for urgent surgery at this time - will follow along  96 07/04/2021, 9:09 AM

## 2021-07-05 ENCOUNTER — Encounter (HOSPITAL_COMMUNITY): Payer: Self-pay | Admitting: Internal Medicine

## 2021-07-05 ENCOUNTER — Inpatient Hospital Stay (HOSPITAL_COMMUNITY): Payer: Medicare Other

## 2021-07-05 LAB — BASIC METABOLIC PANEL
Anion gap: 17 — ABNORMAL HIGH (ref 5–15)
BUN: 25 mg/dL — ABNORMAL HIGH (ref 8–23)
CO2: 20 mmol/L — ABNORMAL LOW (ref 22–32)
Calcium: 8.9 mg/dL (ref 8.9–10.3)
Chloride: 100 mmol/L (ref 98–111)
Creatinine, Ser: 0.9 mg/dL (ref 0.44–1.00)
GFR, Estimated: >60 mL/min (ref >60)
Glucose, Bld: 59 mg/dL — ABNORMAL LOW (ref 70–99)
Potassium: 4.1 mmol/L (ref 3.5–5.1)
Sodium: 137 mmol/L (ref 135–145)

## 2021-07-05 LAB — CBC
HCT: 33.9 % — ABNORMAL LOW (ref 36.0–46.0)
Hemoglobin: 11.3 g/dL — ABNORMAL LOW (ref 12.0–15.0)
MCH: 29.7 pg (ref 26.0–34.0)
MCHC: 33.3 g/dL (ref 30.0–36.0)
MCV: 89.2 fL (ref 80.0–100.0)
Platelets: 338 10*3/uL (ref 150–400)
RBC: 3.8 MIL/uL — ABNORMAL LOW (ref 3.87–5.11)
RDW: 13.3 % (ref 11.5–15.5)
WBC: 11.7 10*3/uL — ABNORMAL HIGH (ref 4.0–10.5)
nRBC: 0 % (ref 0.0–0.2)

## 2021-07-05 LAB — MAGNESIUM: Magnesium: 1.9 mg/dL (ref 1.7–2.4)

## 2021-07-05 LAB — PHOSPHORUS: Phosphorus: 2.8 mg/dL (ref 2.5–4.6)

## 2021-07-05 MED ORDER — VITAMIN D3 25 MCG (1000 UNIT) PO TABS
1000.0000 [IU] | ORAL_TABLET | Freq: Every day | ORAL | Status: DC
  Administered 2021-07-05 – 2021-07-06 (×2): 1000 [IU] via ORAL
  Filled 2021-07-05 (×2): qty 1

## 2021-07-05 MED ORDER — ADULT MULTIVITAMIN W/MINERALS CH
1.0000 | ORAL_TABLET | Freq: Every day | ORAL | Status: DC
  Administered 2021-07-05 – 2021-07-06 (×2): 1 via ORAL
  Filled 2021-07-05 (×2): qty 1

## 2021-07-05 MED ORDER — METOPROLOL TARTRATE 5 MG/5ML IV SOLN: 2.5000 mg | Freq: Four times a day (QID) | INTRAVENOUS | Status: DC | PRN

## 2021-07-05 MED ORDER — ENOXAPARIN SODIUM 40 MG/0.4ML IJ SOSY
40.0000 mg | PREFILLED_SYRINGE | INTRAMUSCULAR | Status: DC
  Filled 2021-07-05: qty 0.4

## 2021-07-05 NOTE — Progress Notes (Signed)
OT Cancellation Note  Patient Details Name: KAISEY HUSEBY MRN: [id number] DOB: 1940/11/26   Cancelled Treatment:    Reason Eval/Treat Not Completed: OT screened, no needs identified, will sign off. Patient reports no difficulties with Adls and functional mobility.  Aidian Salomon L Dreon Pineda 07/05/2021, 12:57 PM

## 2021-07-05 NOTE — Progress Notes (Addendum)
Progress Note     Subjective: Very drowsy this am but feeling better. BM yesterday and no nausea or emesis today. Abdominal pain improved   Objective: Vital signs in last 24 hours: Temp:  [97.9 F (36.6 C)-98.1 F (36.7 C)] 98 F (36.7 C) (11/20 0557) Pulse Rate:  [77-96] 90 (11/20 0557) Resp:  [17-20] 20 (11/20 0557) BP: (115-156)/(65-79) 156/79 (11/20 0557) SpO2:  [97 %-100 %] 100 % (11/20 0557) Last BM Date:  (Pt doesnt remember)  Intake/Output from previous day: 11/19 0701 - 11/20 0700 In: 100 [P.O.:100] Out: 550 [Emesis/NG output:550] Intake/Output this shift: No intake/output data recorded.  PE: General: pleasant, WD, female who is laying in bed in NAD HEENT: head is normocephalic, atraumatic. Mouth is pink and moist Heart: regular, rate, and rhythm.  Palpable radial pulses bilaterally Lungs: CTAB. Respiratory effort nonlabored Abd: soft, NT, ND, +BS, no masses, hernias, or organomegaly. Multiple well healed surgical scars MSK: all 4 extremities are symmetrical with no cyanosis, clubbing, or edema. Skin: warm and dry with no masses, lesions, or rashes Psych: A&Ox3 with an appropriate affect.    Lab Results:  Recent Labs    07/04/21 0614 07/05/21 0407  WBC 9.8 11.7*  HGB 11.6* 11.3*  HCT 34.9* 33.9*  PLT 326 338   BMET Recent Labs    07/04/21 0614 07/05/21 0407  NA 135 137  K 2.9* 4.1  CL 97* 100  CO2 21* 20*  GLUCOSE 72 59*  BUN 36* 25*  CREATININE 1.22* 0.90  CALCIUM 8.6* 8.9   PT/INR No results for input(s): LABPROT, INR in the last 72 hours. CMP     Component Value Date/Time   NA 137 07/05/2021 0407   K 4.1 07/05/2021 0407   CL 100 07/05/2021 0407   CO2 20 (L) 07/05/2021 0407   GLUCOSE 59 (L) 07/05/2021 0407   BUN 25 (H) 07/05/2021 0407   CREATININE 0.90 07/05/2021 0407   CALCIUM 8.9 07/05/2021 0407   PROT 8.4 (H) 07/03/2021 1555   ALBUMIN 4.6 07/03/2021 1555   AST 26 07/03/2021 1555   ALT 17 07/03/2021 1555   ALKPHOS 52  07/03/2021 1555   BILITOT 0.5 07/03/2021 1555   GFRNONAA >60 07/05/2021 0407   GFRAA  12/28/2008 0605    >60        The eGFR has been calculated using the MDRD equation. This calculation has not been validated in all clinical situations. eGFR's persistently <60 mL/min signify possible Chronic Kidney Disease.   Lipase     Component Value Date/Time   LIPASE 50 07/03/2021 1555       Studies/Results: CT ABDOMEN PELVIS WO CONTRAST  Result Date: 07/03/2021 CLINICAL DATA:  Nausea and vomiting and diarrhea for 1 week, initial encounter EXAM: CT ABDOMEN AND PELVIS WITHOUT CONTRAST TECHNIQUE: Multidetector CT imaging of the abdomen and pelvis was performed following the standard protocol without IV contrast. COMPARISON:  None. FINDINGS: Lower chest: No acute abnormality. Hepatobiliary: Gallbladder is partially distended. Multiple small dependent gallstones are noted without complicating factors. Liver is within normal limits. Pancreas: Unremarkable. No pancreatic ductal dilatation or surrounding inflammatory changes. Spleen: Normal in size without focal abnormality. Adrenals/Urinary Tract: Adrenal glands are within normal limits. Kidneys are well visualized bilaterally without renal calculi. Vague hypodensity is noted in the midportion of the right kidney measuring 15 mm most consistent with a cyst. No obstructive changes are seen. The bladder is partially distended. Stomach/Bowel: Colon shows no obstructive or inflammatory changes. Postsurgical changes in the right mid  abdomen are seen consistent with prior partial right colectomy. The appendix is not well seen. Dilated proximal small bowel is noted to include the duodenum and proximal jejunum. A definitive transition zone is not delineated although relative caliber change is noted in the mid left abdomen intimately apposed to the anterior aspect of the left abdominal wall. These changes suggest underlying adhesions. The more distal small bowel  appears within normal limits. Stomach is unremarkable. Vascular/Lymphatic: Aortic atherosclerosis. No enlarged abdominal or pelvic lymph nodes. Reproductive: Status post hysterectomy. No adnexal masses. Other: No abdominal wall hernia or abnormality. No abdominopelvic ascites. Postsurgical changes are noted in the anterior abdominal wall. Musculoskeletal: No acute or significant osseous findings. IMPRESSION: Partial small bowel obstruction in the proximal small bowel involving the duodenum and jejunum. Matting of small bowel loops is noted along the anterior aspect of the left abdominal wall likely related to adhesions and contributing to the obstructive change. Right renal cyst. Cholelithiasis without complicating factors. Electronically Signed   By: Inez Catalina M.D.   On: 07/03/2021 20:50   DG Abdomen 1 View  Result Date: 07/03/2021 CLINICAL DATA:  NG placement. EXAM: ABDOMEN - 1 VIEW COMPARISON:  CT of the abdomen pelvis dated 07/03/2021. FINDINGS: Enteric tube with tip and side-port in the left upper abdomen likely in the gastric fundus. There are bibasilar atelectasis. No focal consolidation, pleural effusion, pneumothorax. The cardiac silhouette is within normal limits. Atherosclerotic calcification of the aorta as well as calcification of the mitral annulus. No acute osseous pathology. Degenerative changes of the spine. IMPRESSION: Enteric tube with tip and side-port in the gastric fundus. Electronically Signed   By: Anner Crete M.D.   On: 07/03/2021 22:45   DG Abd Portable 1V-Small Bowel Obstruction Protocol-initial, 8 hr delay  Result Date: 07/04/2021 CLINICAL DATA:  Small-bowel obstruction EXAM: PORTABLE ABDOMEN - 1 VIEW COMPARISON:  07/03/2021 FINDINGS: Supine frontal view of the abdomen and pelvis demonstrates oral contrast throughout the colon. Decreased caliber of the gas-filled small bowel loops within the left upper quadrant. No evidence of high-grade obstruction. Enteric catheter tip  and side port project over the gastric fundus. No masses or abnormal calcifications. IMPRESSION: 1. Progression of oral contrast throughout the colon, with no evidence of high-grade small bowel obstruction. Decreased gaseous distention of the small bowel. Electronically Signed   By: Randa Ngo M.D.   On: 07/04/2021 23:39    Anti-infectives: Anti-infectives (From admission, onward)    None        Assessment/Plan  SBO - CT 11/18 w/ Partial small bowel obstruction in the proximal small bowel involving the duodenum and jejunum. Matting of small bowel loops is noted along the anterior aspect of the left abdominal wall likely related to adhesions and contributing to the obstructive change -contrast in colon on xray and +BM. Symptoms improved - NGT out this am and start clear liquid diet - Keep K > 4 and Mg > 2 for bowel function - Mobilize for bowel function  FEN: CLD, IVF per primary ID: none currently VTE: lovenox   LOS: 1 day    Winferd Humphrey, [facility] 07/05/2021, 7:41 AM Please see Amion for pager number during day hours 7:00am-4:30pm

## 2021-07-05 NOTE — Progress Notes (Signed)
Pt maintenance fluid stopped and remaninig three iv potassium not given because pt  was complaining of discomfort to iv site and requested for another iv site. Pt was stuck already so many times by iv team  and cannot be stick again except PICC line option. Pt asked nurse  to stop the fluid and requested for po potassium. NP on call made aware and new order received.

## 2021-07-05 NOTE — Progress Notes (Signed)
PROGRESS NOTE  Mandy Bowman XBL:[id number] DOB: 1941/04/27 DOA: 07/03/2021 PCP: Macy Mis, MD  HPI/Recap of past 24 hours: Mandy Bowman is a 80 y.o. female with medical history significant for colon resection, colostomy and reversal who presented to droppage ED with complaints of nausea vomiting x3 to 4 days.  Work-up in the ED revealed ileus versus small bowel obstruction.  NG tube was placed in the ED.  She was subsequently transferred to [facility] and is followed by general surgery.  Patient had a few bowel movements on 07/05/2021, NG tube was removed and she was started on clear liquid diet which she has tolerated.  Patient request to have her diet advanced, will start full liquid diet.    07/05/2021: Patient was seen in her room.  She feels better today.  NG tube is out, she is having bowel movements.  No nausea or abdominal pain.   Assessment/Plan: Principal Problem:   Partial small bowel obstruction (HCC) Active Problems:   AKI (acute kidney injury) (HCC)   HTN (hypertension)  Small bowel obstruction versus partial small bowel obstruction seen on CT scan. NG tube placed in the ED on 07/03/2021, removed on 07/05/2021 Started on clear liquid diet, advance diet as tolerated.  Optimize magnesium and potassium levels Continue IV fluid hydration Mobilize as tolerated. Personally reviewed abdominal x-ray done on 07/05/2021 showing progression of the oral contrast suggestive of improvement of partial small bowel obstruction. Appreciate general surgery assistance.  Essential hypertension Resume home Norvasc  Hyperlipidemia Resume home Pravachol  Chronic anxiety/depression Resume home Cymbalta.   Code Status: Full code  Family Communication: None at bedside  Disposition Plan: Likely will discharge to home once general surgery signs off.   Consultants: General surgery   Procedures: NG tube placement in the ED on 07/03/2021 NG tube removed on  07/05/2021.  Antimicrobials: None  DVT prophylaxis: Subcu Lovenox daily  Status is: Inpatient  Inpatient status.  Patient will require at least 2 midnights for further evaluation and treatment of present condition.      Objective: Vitals:   07/04/21 1815 07/04/21 2213 07/05/21 0556 07/05/21 0557  BP: (!) 141/77 (!) 144/73 (!) 156/79 (!) 156/79  Pulse: 96 86 89 90  Resp: 17 20 20 20   Temp: 98.1 F (36.7 C) 97.9 F (36.6 C) 98 F (36.7 C) 98 F (36.7 C)  TempSrc: Oral Oral Oral Oral  SpO2: 100% 97% 100% 100%  Weight:      Height:        Intake/Output Summary (Last 24 hours) at 07/05/2021 1630 Last data filed at 07/05/2021 0500 Gross per 24 hour  Intake 100 ml  Output 550 ml  Net -450 ml   Filed Weights   07/03/21 1455  Weight: 49.9 kg    Exam:  General: 80 y.o. year-old female well developed well nourished in no acute distress.  Alert and oriented x3. Cardiovascular: Regular rate and rhythm with no rubs or gallops.  No thyromegaly or JVD noted.   Respiratory: Clear to auscultation with no wheezes or rales. Good inspiratory effort. Abdomen: Soft nontender nondistended with normal bowel sounds x4 quadrants. Musculoskeletal: No lower extremity edema. 2/4 pulses in all 4 extremities. Skin: No ulcerative lesions noted or rashes, Psychiatry: Mood is appropriate for condition and setting   Data Reviewed: CBC: Recent Labs  Lab 07/03/21 1555 07/04/21 0614 07/05/21 0407  WBC 13.5* 9.8 11.7*  HGB 12.8 11.6* 11.3*  HCT 38.5 34.9* 33.9*  MCV 88.9 89.9 89.2  PLT 402* 326 338   Basic Metabolic Panel: Recent Labs  Lab 07/03/21 1555 07/04/21 0614 07/04/21 1538 07/05/21 0407  NA 135 135  --  137  K 3.2* 2.9*  --  4.1  CL 91* 97*  --  100  CO2 27 21*  --  20*  GLUCOSE 90 72  --  59*  BUN 37* 36*  --  25*  CREATININE 1.76* 1.22*  --  0.90  CALCIUM 9.8 8.6*  --  8.9  MG  --   --  2.0 1.9  PHOS  --   --   --  2.8   GFR: Estimated Creatinine Clearance:  35 mL/min (by C-G formula based on SCr of 0.9 mg/dL). Liver Function Tests: Recent Labs  Lab 07/03/21 1555  AST 26  ALT 17  ALKPHOS 52  BILITOT 0.5  PROT 8.4*  ALBUMIN 4.6   Recent Labs  Lab 07/03/21 1555  LIPASE 50   No results for input(s): AMMONIA in the last 168 hours. Coagulation Profile: No results for input(s): INR, PROTIME in the last 168 hours. Cardiac Enzymes: No results for input(s): CKTOTAL, CKMB, CKMBINDEX, TROPONINI in the last 168 hours. BNP (last 3 results) No results for input(s): PROBNP in the last 8760 hours. HbA1C: No results for input(s): HGBA1C in the last 72 hours. CBG: No results for input(s): GLUCAP in the last 168 hours. Lipid Profile: No results for input(s): CHOL, HDL, LDLCALC, TRIG, CHOLHDL, LDLDIRECT in the last 72 hours. Thyroid Function Tests: No results for input(s): TSH, T4TOTAL, FREET4, T3FREE, THYROIDAB in the last 72 hours. Anemia Panel: No results for input(s): VITAMINB12, FOLATE, FERRITIN, TIBC, IRON, RETICCTPCT in the last 72 hours. Urine analysis:    Component Value Date/Time   COLORURINE YELLOW 01/26/2008 1622   APPEARANCEUR HAZY (A) 01/26/2008 1622   LABSPEC 1.018 01/26/2008 1622   PHURINE 6.5 01/26/2008 1622   GLUCOSEU NEGATIVE 01/26/2008 1622   HGBUR NEGATIVE 01/26/2008 1622   BILIRUBINUR NEGATIVE 01/26/2008 1622   KETONESUR NEGATIVE 01/26/2008 1622   PROTEINUR NEGATIVE 01/26/2008 1622   UROBILINOGEN 0.2 01/26/2008 1622   NITRITE NEGATIVE 01/26/2008 1622   LEUKOCYTESUR  01/26/2008 1622    NEGATIVE MICROSCOPIC NOT DONE ON URINES WITH NEGATIVE PROTEIN, BLOOD, LEUKOCYTES, NITRITE, OR GLUCOSE <1000 mg/dL.   Sepsis Labs: @LABRCNTIP (procalcitonin:4,lacticidven:4)  ) Recent Results (from the past 240 hour(s))  Resp Panel by RT-PCR (Flu A&B, Covid) Nasopharyngeal Swab     Status: None   Collection Time: 07/03/21  8:07 PM   Specimen: Nasopharyngeal Swab; Nasopharyngeal(NP) swabs in vial transport medium  Result Value Ref  Range Status   SARS Coronavirus 2 by RT PCR NEGATIVE NEGATIVE Final    Comment: (NOTE) SARS-CoV-2 target nucleic acids are NOT DETECTED.  The SARS-CoV-2 RNA is generally detectable in upper respiratory specimens during the acute phase of infection. The lowest concentration of SARS-CoV-2 viral copies this assay can detect is 138 copies/mL. A negative result does not preclude SARS-Cov-2 infection and should not be used as the sole basis for treatment or other patient management decisions. A negative result may occur with  improper specimen collection/handling, submission of specimen other than nasopharyngeal swab, presence of viral mutation(s) within the areas targeted by this assay, and inadequate number of viral copies(<138 copies/mL). A negative result must be combined with clinical observations, patient history, and epidemiological information. The expected result is Negative.  Fact Sheet for Patients:  07/05/21  Fact Sheet for Healthcare Providers:  BloggerCourse.com  This test is no t yet  approved or cleared by the [place] and  has been authorized for detection and/or diagnosis of SARS-CoV-2 by FDA under an Emergency Use Authorization (EUA). This EUA will remain  in effect (meaning this test can be used) for the duration of the COVID-19 declaration under Section 564(b)(1) of the Act, 21 U.S.C.section 360bbb-3(b)(1), unless the authorization is terminated  or revoked sooner.       Influenza A by PCR NEGATIVE NEGATIVE Final   Influenza B by PCR NEGATIVE NEGATIVE Final    Comment: (NOTE) The Xpert Xpress SARS-CoV-2/FLU/RSV plus assay is intended as an aid in the diagnosis of influenza from Nasopharyngeal swab specimens and should not be used as a sole basis for treatment. Nasal washings and aspirates are unacceptable for Xpert Xpress SARS-CoV-2/FLU/RSV testing.  Fact Sheet for  Patients: BloggerCourse.com  Fact Sheet for Healthcare Providers: SeriousBroker.it  This test is not yet approved or cleared by the [place] FDA and has been authorized for detection and/or diagnosis of SARS-CoV-2 by FDA under an Emergency Use Authorization (EUA). This EUA will remain in effect (meaning this test can be used) for the duration of the COVID-19 declaration under Section 564(b)(1) of the Act, 21 U.S.C. section 360bbb-3(b)(1), unless the authorization is terminated or revoked.  Performed at [facility], [address] [place], [place] [address]       Studies: DG Abd Portable 1V-Small Bowel Obstruction Protocol-initial, 8 hr delay  Result Date: 07/04/2021 CLINICAL DATA:  Small-bowel obstruction EXAM: PORTABLE ABDOMEN - 1 VIEW COMPARISON:  07/03/2021 FINDINGS: Supine frontal view of the abdomen and pelvis demonstrates oral contrast throughout the colon. Decreased caliber of the gas-filled small bowel loops within the left upper quadrant. No evidence of high-grade obstruction. Enteric catheter tip and side port project over the gastric fundus. No masses or abnormal calcifications. IMPRESSION: 1. Progression of oral contrast throughout the colon, with no evidence of high-grade small bowel obstruction. Decreased gaseous distention of the small bowel. Electronically Signed   By: Sharlet Salina M.D.   On: 07/04/2021 23:39    Scheduled Meds:  amLODipine  10 mg Oral Daily   DULoxetine  30 mg Oral Daily   [START ON 07/06/2021] enoxaparin (LOVENOX) injection  40 mg Subcutaneous Q24H   pravastatin  20 mg Oral Daily    Continuous Infusions:  lactated ringers Stopped (07/05/21 1400)     LOS: 1 day     Darlin Drop, MD [facility] Pager [phone]  If 7PM-7AM, please contact night-coverage [email or web address] Password [facility] 07/05/2021, 4:30 PM

## 2021-07-05 NOTE — Evaluation (Signed)
Physical Therapy Evaluation Patient Details Name: Mandy Bowman MRN: [id number] DOB: September 15, 1940 Today's Date: 07/05/2021  History of Present Illness  80 y.o. female admitted with nausea/vomiting. Dx of SBO and AKI. Pt with medical history significant of Abd surgery, HTN.  Clinical Impression  Patient evaluated by Physical Therapy with no further acute PT needs identified. All education has been completed and the patient has no further questions.  Pt is a very pleasant and independent lady at her baseline, she normally amb with [facility]. Pt amb hallway distance mod I today, no LOB, no difficulty. Pt reports she has supportive family if needs should arise. No further needs at this time. Encouraged pt to continue mobilization/OOB with staff as tolerated  See below for any follow-up Physical Therapy or equipment needs. PT is signing off. Thank you for this referral.        Recommendations for follow up therapy are one component of a multi-disciplinary discharge planning process, led by the attending physician.  Recommendations may be updated based on patient status, additional functional criteria and insurance authorization.  Follow Up Recommendations No PT follow up    Assistance Recommended at Discharge None  Functional Status Assessment Patient has had a recent decline in their functional status and demonstrates the ability to make significant improvements in function in a reasonable and predictable amount of time.  Equipment Recommendations  None recommended by PT    Recommendations for Other Services       Precautions / Restrictions Precautions Precautions: None Restrictions Weight Bearing Restrictions: No      Mobility  Bed Mobility Overal bed mobility: Modified Independent             General bed mobility comments: incr time, no physical assist    Transfers Overall transfer level: Needs assistance Equipment used: Rolling walker (2 wheels) Transfers: Sit to/from  Stand Sit to Stand: Supervision           General transfer comment: for safety from recliner and to bed, no physical assist    Ambulation/Gait Ambulation/Gait assistance: Supervision;Min guard Gait Distance (Feet): 140 Feet Assistive device: Quad cane Gait Pattern/deviations: Step-through pattern       General Gait Details: no LOB, slower but steady gait  Stairs            Wheelchair Mobility    Modified Rankin (Stroke Patients Only)       Balance     Sitting balance-Leahy Scale: Good     Standing balance support: Single extremity supported;During functional activity   Standing balance comment: no LOB with dynamic activities with unilateral UE support             High level balance activites: Side stepping;Turns;Head turns;Direction changes High Level Balance Comments: no LOB with above             Pertinent Vitals/Pain Pain Assessment: 0-10 Pain Score: 3  Pain Location: RUE diffuse pain (arthritis) Pain Descriptors / Indicators: Aching Pain Intervention(s): Monitored during session    Home Living Family/patient expects to be discharged to:: Private residence Living Arrangements: Alone Available Help at Discharge: Family Type of Home: House         Home Layout: One level Home Equipment: Cane - quad Additional Comments: adjusted QC for use in pt L hand    Prior Function Prior Level of Function : Independent/Modified Independent             Mobility Comments: mod I amb with SBQC ADLs Comments: pt reports independence with  ADLs     Hand Dominance        Extremity/Trunk Assessment   Upper Extremity Assessment Upper Extremity Assessment: Defer to OT evaluation    Lower Extremity Assessment Lower Extremity Assessment: Overall WFL for tasks assessed       Communication   Communication: No difficulties  Cognition Arousal/Alertness: Awake/alert Behavior During Therapy: WFL for tasks assessed/performed Overall Cognitive  Status: Within Functional Limits for tasks assessed                                          General Comments      Exercises     Assessment/Plan    PT Assessment Patient does not need any further PT services  PT Problem List         PT Treatment Interventions      PT Goals (Current goals can be found in the Care Plan section)  Acute Rehab PT Goals PT Goal Formulation: All assessment and education complete, DC therapy    Frequency     Barriers to discharge        Co-evaluation               AM-PAC PT "6 Clicks" Mobility  Outcome Measure Help needed turning from your back to your side while in a flat bed without using bedrails?: None Help needed moving from lying on your back to sitting on the side of a flat bed without using bedrails?: None Help needed moving to and from a bed to a chair (including a wheelchair)?: None Help needed standing up from a chair using your arms (e.g., wheelchair or bedside chair)?: None Help needed to walk in hospital room?: None Help needed climbing 3-5 steps with a railing? : None 6 Click Score: 24    End of Session   Activity Tolerance: Patient tolerated treatment well Patient left: with call bell/phone within reach;in bed;with bed alarm set   PT Visit Diagnosis: Other abnormalities of gait and mobility (R26.89)    Time: 3329-5188 PT Time Calculation (min) (ACUTE ONLY): 19 min   Charges:   PT Evaluation $PT Eval Low Complexity: 1 Low          Bertie Simien, PT  Acute Rehab Dept ([facility]) [phone] Pager [phone]  07/05/2021   [facility] 07/05/2021, 11:10 AM

## 2021-07-06 LAB — BASIC METABOLIC PANEL
Anion gap: 12 (ref 5–15)
BUN: 13 mg/dL (ref 8–23)
CO2: 25 mmol/L (ref 22–32)
Calcium: 8.7 mg/dL — ABNORMAL LOW (ref 8.9–10.3)
Chloride: 98 mmol/L (ref 98–111)
Creatinine, Ser: 0.64 mg/dL (ref 0.44–1.00)
GFR, Estimated: >60 mL/min (ref >60)
Glucose, Bld: 105 mg/dL — ABNORMAL HIGH (ref 70–99)
Potassium: 3.5 mmol/L (ref 3.5–5.1)
Sodium: 135 mmol/L (ref 135–145)

## 2021-07-06 LAB — CBC
HCT: 33.3 % — ABNORMAL LOW (ref 36.0–46.0)
Hemoglobin: 11.3 g/dL — ABNORMAL LOW (ref 12.0–15.0)
MCH: 30 pg (ref 26.0–34.0)
MCHC: 33.9 g/dL (ref 30.0–36.0)
MCV: 88.3 fL (ref 80.0–100.0)
Platelets: 345 10*3/uL (ref 150–400)
RBC: 3.77 MIL/uL — ABNORMAL LOW (ref 3.87–5.11)
RDW: 13 % (ref 11.5–15.5)
WBC: 8.6 10*3/uL (ref 4.0–10.5)
nRBC: 0 % (ref 0.0–0.2)

## 2021-07-06 LAB — MAGNESIUM: Magnesium: 1.8 mg/dL (ref 1.7–2.4)

## 2021-07-06 MED ORDER — LOSARTAN POTASSIUM 50 MG PO TABS: 100.0000 mg | ORAL_TABLET | Freq: Every day | ORAL | Status: DC

## 2021-07-06 NOTE — Plan of Care (Signed)
  Problem: Clinical Measurements: Goal: Diagnostic test results will improve Outcome: Adequate for Discharge   Problem: Clinical Measurements: Goal: Respiratory complications will improve Outcome: Adequate for Discharge   Problem: Clinical Measurements: Goal: Cardiovascular complication will be avoided Outcome: Adequate for Discharge   Problem: Clinical Measurements: Goal: Ability to maintain clinical measurements within normal limits will improve Outcome: Adequate for Discharge

## 2021-07-06 NOTE — TOC Progression Note (Signed)
Transition of Care [facility]) - Progression Note    Patient Details  Name: Mandy Bowman MRN: [id number] Date of Birth: 28-Oct-1940  Transition of Care [facility]) CM/SW Contact  Geni Bers, RN Phone Number: 07/06/2021, 1:22 PM  Clinical Narrative:     Pt plan to discharge home with no needs at present time.   Expected Discharge Plan: Home/Self Care Barriers to Discharge: No Barriers Identified  Expected Discharge Plan and Services Expected Discharge Plan: Home/Self Care       Living arrangements for the past 2 months: Single Family Home Expected Discharge Date: 07/06/21                                     Social Determinants of Health (SDOH) Interventions    Readmission Risk Interventions No flowsheet data found.

## 2021-07-06 NOTE — Discharge Summary (Signed)
Discharge Summary  Mandy Bowman SKA:[id number] DOB: [birth year]  PCP: Macy Mis, MD  Admit date: 07/03/2021 Discharge date: 07/06/2021  Time spent: 35 minutes  Recommendations for Outpatient Follow-up:  Follow-up with your primary care provider  Discharge Diagnoses:  Active Hospital Problems   Diagnosis Date Noted   Partial small bowel obstruction (HCC) 07/03/2021   AKI (acute kidney injury) (HCC) 07/04/2021   HTN (hypertension) 07/04/2021    Resolved Hospital Problems  No resolved problems to display.    Discharge Condition: Stable  Diet recommendation: Resume previous diet.  Vitals:   07/05/21 1946 07/06/21 0457  BP: 128/66 (!) 150/68  Pulse: 79 82  Resp: 18 20  Temp: 98.3 F (36.8 C) 99.9 F (37.7 C)  SpO2: 100% 100%    History of present illness:   Mandy Bowman is a 81 y.o. female with medical history significant for colon resection, colostomy and reversal who presented to droppage ED with complaints of nausea vomiting x3 to 4 days.  Work-up in the ED revealed ileus versus small bowel obstruction.  NG tube was placed in the ED.  She was subsequently transferred to [facility] and is followed by general surgery.  Patient had a few bowel movements on 07/05/2021, NG tube was removed and she was started on clear liquid diet which she has tolerated.  Patient request to have her diet advanced, will start full liquid diet.  NG tube removed on 07/05/2021, passing flatus and having bowel movements, she tolerated her diet which was advanced.  Seen by general surgery, signed off.   07/06/2021: Seen and examined at her bedside.  She had no new complaints.  She is eager to go home  Hospital Course:  Principal Problem:   Partial small bowel obstruction (HCC) Active Problems:   AKI (acute kidney injury) (HCC)   HTN (hypertension)  Resolved partial small bowel obstruction seen on CT scan. NG tube placed in the ED on 07/03/2021, removed on 07/05/2021 Started  on clear liquid diet, advanced diet to soft which she tolerated well. Seen by general surgery.  Resolution of bowel function since 07/05/2021. Okay to discharge from general surgery.  Patient is eager to go home.   Essential hypertension Continue home Norvasc and losartan.   Recommend to hold off HCTZ to avoid hyponatremia and other electrolytes abnormalities. Follow-up with your primary care provider  Hyperlipidemia Continue home Pravachol  Chronic anxiety/depression Continue home Cymbalta.     Code Status: Full code    Consultants: General surgery     Procedures: NG tube placement in the ED on 07/03/2021 NG tube removed on 07/05/2021.   Antimicrobials: None      Procedures: NG tube placement and removal.  Consultations: General surgery.  Discharge Exam: BP (!) 150/68 (BP Location: Right Arm)   Pulse 82   Temp 99.9 F (37.7 C) (Oral)   Resp 20   Ht 4\' 10"  (1.473 m)   Wt 49.9 kg   SpO2 100%   BMI 22.99 kg/m  General: 80 y.o. year-old female well developed well nourished in no acute distress.  Alert and oriented x3. Cardiovascular: Regular rate and rhythm with no rubs or gallops.  No thyromegaly or JVD noted.   Respiratory: Clear to auscultation with no wheezes or rales. Good inspiratory effort. Abdomen: Soft nontender nondistended with normal bowel sounds x4 quadrants. Musculoskeletal: No lower extremity edema. 2/4 pulses in all 4 extremities. Skin: No ulcerative lesions noted or rashes, Psychiatry: Mood is appropriate for condition and setting  Discharge Instructions You were cared for by a hospitalist during your hospital stay. If you have any questions about your discharge medications or the care you received while you were in the hospital after you are discharged, you can call the unit and asked to speak with the hospitalist on call if the hospitalist that took care of you is not available. Once you are discharged, your primary care physician will handle  any further medical issues. Please note that NO REFILLS for any discharge medications will be authorized once you are discharged, as it is imperative that you return to your primary care physician (or establish a relationship with a primary care physician if you do not have one) for your aftercare needs so that they can reassess your need for medications and monitor your lab values.   Allergies as of 07/06/2021   No Known Allergies      Medication List     STOP taking these medications    hydrochlorothiazide 25 MG tablet Commonly known as: HYDRODIURIL       TAKE these medications    acetaminophen 500 MG tablet Commonly known as: TYLENOL Take 1,000 mg by mouth every 6 (six) hours as needed for mild pain or headache.   amLODipine 10 MG tablet Commonly known as: NORVASC Take 10 mg by mouth daily.   aspirin EC 81 MG tablet Take 81 mg by mouth daily.   CALTRATE 600+D PO Take 1 tablet by mouth 2 (two) times daily.   cetirizine 10 MG tablet Commonly known as: ZYRTEC Take 10 mg by mouth daily.   diclofenac 75 MG EC tablet Commonly known as: VOLTAREN Take 75 mg by mouth daily as needed (for pain).   DULoxetine 30 MG capsule Commonly known as: CYMBALTA Take 30 mg by mouth daily.   losartan 100 MG tablet Commonly known as: COZAAR Take 100 mg by mouth daily.   multivitamin with minerals Tabs tablet Take 1 tablet by mouth daily.   pravastatin 20 MG tablet Commonly known as: PRAVACHOL Take 20 mg by mouth daily.   ranitidine 150 MG tablet Commonly known as: ZANTAC Take 150 mg by mouth 2 (two) times daily as needed for heartburn.   traMADol 50 MG tablet Commonly known as: ULTRAM Take 50 mg by mouth every 6 (six) hours.   Vitamin D (Cholecalciferol) 25 MCG (1000 UT) Caps Take 1,000 Units by mouth daily.       No Known Allergies  Follow-up Information     Macy Mis, MD. Call in 1 day(s).   Specialty: Family Medicine Why: Please call for a post hospital  follow up appointment. Contact information: [address] [place] [address] [phone]                  The results of significant diagnostics from this hospitalization (including imaging, microbiology, ancillary and laboratory) are listed below for reference.    Significant Diagnostic Studies: CT ABDOMEN PELVIS WO CONTRAST  Result Date: 07/03/2021 CLINICAL DATA:  Nausea and vomiting and diarrhea for 1 week, initial encounter EXAM: CT ABDOMEN AND PELVIS WITHOUT CONTRAST TECHNIQUE: Multidetector CT imaging of the abdomen and pelvis was performed following the standard protocol without IV contrast. COMPARISON:  None. FINDINGS: Lower chest: No acute abnormality. Hepatobiliary: Gallbladder is partially distended. Multiple small dependent gallstones are noted without complicating factors. Liver is within normal limits. Pancreas: Unremarkable. No pancreatic ductal dilatation or surrounding inflammatory changes. Spleen: Normal in size without focal abnormality. Adrenals/Urinary Tract: Adrenal glands  are within normal limits. Kidneys are well visualized bilaterally without renal calculi. Vague hypodensity is noted in the midportion of the right kidney measuring 15 mm most consistent with a cyst. No obstructive changes are seen. The bladder is partially distended. Stomach/Bowel: Colon shows no obstructive or inflammatory changes. Postsurgical changes in the right mid abdomen are seen consistent with prior partial right colectomy. The appendix is not well seen. Dilated proximal small bowel is noted to include the duodenum and proximal jejunum. A definitive transition zone is not delineated although relative caliber change is noted in the mid left abdomen intimately apposed to the anterior aspect of the left abdominal wall. These changes suggest underlying adhesions. The more distal small bowel appears within normal limits. Stomach is unremarkable. Vascular/Lymphatic: Aortic  atherosclerosis. No enlarged abdominal or pelvic lymph nodes. Reproductive: Status post hysterectomy. No adnexal masses. Other: No abdominal wall hernia or abnormality. No abdominopelvic ascites. Postsurgical changes are noted in the anterior abdominal wall. Musculoskeletal: No acute or significant osseous findings. IMPRESSION: Partial small bowel obstruction in the proximal small bowel involving the duodenum and jejunum. Matting of small bowel loops is noted along the anterior aspect of the left abdominal wall likely related to adhesions and contributing to the obstructive change. Right renal cyst. Cholelithiasis without complicating factors. Electronically Signed   By: Alcide Clever M.D.   On: 07/03/2021 20:50   DG Abdomen 1 View  Result Date: 07/03/2021 CLINICAL DATA:  NG placement. EXAM: ABDOMEN - 1 VIEW COMPARISON:  CT of the abdomen pelvis dated 07/03/2021. FINDINGS: Enteric tube with tip and side-port in the left upper abdomen likely in the gastric fundus. There are bibasilar atelectasis. No focal consolidation, pleural effusion, pneumothorax. The cardiac silhouette is within normal limits. Atherosclerotic calcification of the aorta as well as calcification of the mitral annulus. No acute osseous pathology. Degenerative changes of the spine. IMPRESSION: Enteric tube with tip and side-port in the gastric fundus. Electronically Signed   By: Elgie Collard M.D.   On: 07/03/2021 22:45   DG Abd Portable 1V  Result Date: 07/05/2021 CLINICAL DATA:  Small-bowel obstruction EXAM: PORTABLE ABDOMEN - 1 VIEW COMPARISON:  07/04/2021 FINDINGS: Supine frontal view of the abdomen and pelvis demonstrates progressive oral contrast throughout the colon to the level of the rectum. Decreased gaseous distention of the small bowel. No masses or abnormal calcifications. Extensive postsurgical changes throughout the lower abdomen and pelvis with multiple surgical clips identified. IMPRESSION: 1. Further progression of oral  contrast throughout the colon. Decreased gaseous distention of the bowel with no evidence of high-grade obstruction. Electronically Signed   By: Sharlet Salina M.D.   On: 07/05/2021 16:50   DG Abd Portable 1V-Small Bowel Obstruction Protocol-initial, 8 hr delay  Result Date: 07/04/2021 CLINICAL DATA:  Small-bowel obstruction EXAM: PORTABLE ABDOMEN - 1 VIEW COMPARISON:  07/03/2021 FINDINGS: Supine frontal view of the abdomen and pelvis demonstrates oral contrast throughout the colon. Decreased caliber of the gas-filled small bowel loops within the left upper quadrant. No evidence of high-grade obstruction. Enteric catheter tip and side port project over the gastric fundus. No masses or abnormal calcifications. IMPRESSION: 1. Progression of oral contrast throughout the colon, with no evidence of high-grade small bowel obstruction. Decreased gaseous distention of the small bowel. Electronically Signed   By: Sharlet Salina M.D.   On: 07/04/2021 23:39    Microbiology: Recent Results (from the past 240 hour(s))  Resp Panel by RT-PCR (Flu A&B, Covid) Nasopharyngeal Swab     Status: None  Collection Time: 07/03/21  8:07 PM   Specimen: Nasopharyngeal Swab; Nasopharyngeal(NP) swabs in vial transport medium  Result Value Ref Range Status   SARS Coronavirus 2 by RT PCR NEGATIVE NEGATIVE Final    Comment: (NOTE) SARS-CoV-2 target nucleic acids are NOT DETECTED.  The SARS-CoV-2 RNA is generally detectable in upper respiratory specimens during the acute phase of infection. The lowest concentration of SARS-CoV-2 viral copies this assay can detect is 138 copies/mL. A negative result does not preclude SARS-Cov-2 infection and should not be used as the sole basis for treatment or other patient management decisions. A negative result may occur with  improper specimen collection/handling, submission of specimen other than nasopharyngeal swab, presence of viral mutation(s) within the areas targeted by this  assay, and inadequate number of viral copies(<138 copies/mL). A negative result must be combined with clinical observations, patient history, and epidemiological information. The expected result is Negative.  Fact Sheet for Patients:  BloggerCourse.com  Fact Sheet for Healthcare Providers:  SeriousBroker.it  This test is no t yet approved or cleared by the [place] FDA and  has been authorized for detection and/or diagnosis of SARS-CoV-2 by FDA under an Emergency Use Authorization (EUA). This EUA will remain  in effect (meaning this test can be used) for the duration of the COVID-19 declaration under Section 564(b)(1) of the Act, 21 U.S.C.section 360bbb-3(b)(1), unless the authorization is terminated  or revoked sooner.       Influenza A by PCR NEGATIVE NEGATIVE Final   Influenza B by PCR NEGATIVE NEGATIVE Final    Comment: (NOTE) The Xpert Xpress SARS-CoV-2/FLU/RSV plus assay is intended as an aid in the diagnosis of influenza from Nasopharyngeal swab specimens and should not be used as a sole basis for treatment. Nasal washings and aspirates are unacceptable for Xpert Xpress SARS-CoV-2/FLU/RSV testing.  Fact Sheet for Patients: BloggerCourse.com  Fact Sheet for Healthcare Providers: SeriousBroker.it  This test is not yet approved or cleared by the [place] FDA and has been authorized for detection and/or diagnosis of SARS-CoV-2 by FDA under an Emergency Use Authorization (EUA). This EUA will remain in effect (meaning this test can be used) for the duration of the COVID-19 declaration under Section 564(b)(1) of the Act, 21 U.S.C. section 360bbb-3(b)(1), unless the authorization is terminated or revoked.  Performed at [facility], [address], [place], [place] [address]      Labs: Basic Metabolic Panel: Recent Labs  Lab 07/03/21 1555  07/04/21 0614 07/04/21 1538 07/05/21 0407 07/06/21 0641  NA 135 135  --  137 135  K 3.2* 2.9*  --  4.1 3.5  CL 91* 97*  --  100 98  CO2 27 21*  --  20* 25  GLUCOSE 90 72  --  59* 105*  BUN 37* 36*  --  25* 13  CREATININE 1.76* 1.22*  --  0.90 0.64  CALCIUM 9.8 8.6*  --  8.9 8.7*  MG  --   --  2.0 1.9 1.8  PHOS  --   --   --  2.8  --    Liver Function Tests: Recent Labs  Lab 07/03/21 1555  AST 26  ALT 17  ALKPHOS 52  BILITOT 0.5  PROT 8.4*  ALBUMIN 4.6   Recent Labs  Lab 07/03/21 1555  LIPASE 50   No results for input(s): AMMONIA in the last 168 hours. CBC: Recent Labs  Lab 07/03/21 1555 07/04/21 0614 07/05/21 0407 07/06/21 0641  WBC 13.5* 9.8 11.7* 8.6  HGB 12.8 11.6* 11.3* 11.3*  HCT 38.5 34.9* 33.9* 33.3*  MCV 88.9 89.9 89.2 88.3  PLT 402* 326 338 345   Cardiac Enzymes: No results for input(s): CKTOTAL, CKMB, CKMBINDEX, TROPONINI in the last 168 hours. BNP: BNP (last 3 results) No results for input(s): BNP in the last 8760 hours.  ProBNP (last 3 results) No results for input(s): PROBNP in the last 8760 hours.  CBG: No results for input(s): GLUCAP in the last 168 hours.     Signed:  Darlin Drop, MD [facility] 07/06/2021, 4:52 PM

## 2021-07-06 NOTE — Progress Notes (Signed)
Patient ID: Mandy Bowman, female   DOB: 01-17-1941, 80 y.o.   MRN: [id number] [facility] Progress Note     Subjective: CC-  Feeling much better. Denies abdominal pain, nausea, vomiting. Passing flatus and had a BM. Tolerating liquids.  Objective: Vital signs in last 24 hours: Temp:  [98.3 F (36.8 C)-99.9 F (37.7 C)] 99.9 F (37.7 C) (11/21 0457) Pulse Rate:  [79-82] 82 (11/21 0457) Resp:  [18-20] 20 (11/21 0457) BP: (128-150)/(66-68) 150/68 (11/21 0457) SpO2:  [100 %] 100 % (11/21 0457) Last BM Date: 07/05/21  Intake/Output from previous day: No intake/output data recorded. Intake/Output this shift: No intake/output data recorded.  PE: Gen:  Alert, NAD, pleasant Pulm: rate and effort normal Abd: Soft, NT/ND, +BS  Lab Results:  Recent Labs    07/05/21 0407 07/06/21 0641  WBC 11.7* 8.6  HGB 11.3* 11.3*  HCT 33.9* 33.3*  PLT 338 345   BMET Recent Labs    07/05/21 0407 07/06/21 0641  NA 137 135  K 4.1 3.5  CL 100 98  CO2 20* 25  GLUCOSE 59* 105*  BUN 25* 13  CREATININE 0.90 0.64  CALCIUM 8.9 8.7*   PT/INR No results for input(s): LABPROT, INR in the last 72 hours. CMP     Component Value Date/Time   NA 135 07/06/2021 0641   K 3.5 07/06/2021 0641   CL 98 07/06/2021 0641   CO2 25 07/06/2021 0641   GLUCOSE 105 (H) 07/06/2021 0641   BUN 13 07/06/2021 0641   CREATININE 0.64 07/06/2021 0641   CALCIUM 8.7 (L) 07/06/2021 0641   PROT 8.4 (H) 07/03/2021 1555   ALBUMIN 4.6 07/03/2021 1555   AST 26 07/03/2021 1555   ALT 17 07/03/2021 1555   ALKPHOS 52 07/03/2021 1555   BILITOT 0.5 07/03/2021 1555   GFRNONAA >60 07/06/2021 0641   GFRAA  12/28/2008 0605    >60        The eGFR has been calculated using the MDRD equation. This calculation has not been validated in all clinical situations. eGFR's persistently <60 mL/min signify possible Chronic Kidney Disease.   Lipase     Component Value Date/Time   LIPASE 50 07/03/2021 1555        Studies/Results: DG Abd Portable 1V  Result Date: 07/05/2021 CLINICAL DATA:  Small-bowel obstruction EXAM: PORTABLE ABDOMEN - 1 VIEW COMPARISON:  07/04/2021 FINDINGS: Supine frontal view of the abdomen and pelvis demonstrates progressive oral contrast throughout the colon to the level of the rectum. Decreased gaseous distention of the small bowel. No masses or abnormal calcifications. Extensive postsurgical changes throughout the lower abdomen and pelvis with multiple surgical clips identified. IMPRESSION: 1. Further progression of oral contrast throughout the colon. Decreased gaseous distention of the bowel with no evidence of high-grade obstruction. Electronically Signed   By: Randa Ngo M.D.   On: 07/05/2021 16:50   DG Abd Portable 1V-Small Bowel Obstruction Protocol-initial, 8 hr delay  Result Date: 07/04/2021 CLINICAL DATA:  Small-bowel obstruction EXAM: PORTABLE ABDOMEN - 1 VIEW COMPARISON:  07/03/2021 FINDINGS: Supine frontal view of the abdomen and pelvis demonstrates oral contrast throughout the colon. Decreased caliber of the gas-filled small bowel loops within the left upper quadrant. No evidence of high-grade obstruction. Enteric catheter tip and side port project over the gastric fundus. No masses or abnormal calcifications. IMPRESSION: 1. Progression of oral contrast throughout the colon, with no evidence of high-grade small bowel obstruction. Decreased gaseous distention of the small bowel. Electronically Signed   By:  Randa Ngo M.D.   On: 07/04/2021 23:39    Anti-infectives: Anti-infectives (From admission, onward)    None        Assessment/Plan  SBO - CT 11/18 w/ Partial small bowel obstruction in the proximal small bowel involving the duodenum and jejunum. Matting of small bowel loops is noted along the anterior aspect of the left abdominal wall likely related to adhesions and contributing to the obstructive change - contrast in colon on xray and +BM.  Symptoms improved - tolerating liquids and having bowel function. Advance diet as tolerated. [place] for discharge if she tolerates this.  We will sign off, please call with questions or concerns.    FEN: dysphagia 3 diet ID: none currently VTE: lovenox   LOS: 2 days    Wellington Hampshire, [facility] 07/06/2021, 9:27 AM Please see Amion for pager number during day hours 7:00am-4:30pm

## 2021-07-09 ENCOUNTER — Emergency Department (HOSPITAL_COMMUNITY)
Admission: EM | Admit: 2021-07-09 | Discharge: 2021-07-09 | Disposition: A | Discharge status: Home / Self Care | Payer: Medicare Other | Attending: Emergency Medicine | Admitting: Emergency Medicine

## 2021-07-09 ENCOUNTER — Other Ambulatory Visit: Payer: Self-pay

## 2021-07-09 ENCOUNTER — Encounter (HOSPITAL_COMMUNITY): Payer: Self-pay

## 2021-07-09 DIAGNOSIS — B029 Zoster without complications: Secondary | ICD-10-CM | POA: Diagnosis not present

## 2021-07-09 DIAGNOSIS — I10 Essential (primary) hypertension: Secondary | ICD-10-CM | POA: Diagnosis not present

## 2021-07-09 DIAGNOSIS — R21 Rash and other nonspecific skin eruption: Secondary | ICD-10-CM | POA: Diagnosis present

## 2021-07-09 DIAGNOSIS — Z79899 Other long term (current) drug therapy: Secondary | ICD-10-CM | POA: Diagnosis not present

## 2021-07-09 DIAGNOSIS — Z7982 Long term (current) use of aspirin: Secondary | ICD-10-CM | POA: Insufficient documentation

## 2021-07-09 NOTE — Discharge Instructions (Signed)
Follow back up with your primary care doctor as scheduled for Monday.  You over the culture results.  Clinically this is highly suggestive of herpes zoster or commonly known as shingles.  Continue to take the Valtrex medication.  Can supplement with Tylenol as needed.  Return for the development of any right eye pain or severe light sensitivity to the eye.  This would go along with the shingles developing on the right eyeball.  But most likely would have done that by now.

## 2021-07-09 NOTE — ED Provider Notes (Signed)
[facility] DEPT Provider Note   CSN: [id number] Arrival date & time: 07/09/21  1712     History Chief Complaint  Patient presents with   Rash    Mandy Bowman is a 80 y.o. female.  Patient with recent hospitalization for small bowel obstruction.  Resolved without surgery.  Seen in follow-up by her primary care doctor yesterday.  At that time patient was developing a rash on the right side of her forehead.  Her primary care doctor who is in the [facility] system was suspicious that it may be shingles.  Primary care doctor did cultures for herpes and started her on Valtrex.  Patient here because now has obvious vesicles.  A few of them are open.  No eye pain no photophobia.  Does include right side of forehead.  There is some swelling to the right upper eyelid.  But no vesicles on the eyelid.  There is no vesicle on the bridge of the nose or the tip of the nose.  The vesicles do not cross the midline.      Past Medical History:  Diagnosis Date   HLD (hyperlipidemia)    Hypertension     Patient Active Problem List   Diagnosis Date Noted   AKI (acute kidney injury) ([facility]) 07/04/2021   HTN (hypertension) 07/04/2021   Partial small bowel obstruction ([facility]) 07/03/2021    Past Surgical History:  Procedure Laterality Date   ABDOMINAL SURGERY     COLON SURGERY     COLOSTOMY REVERSAL       OB History   No obstetric history on file.     Family History  Problem Relation Age of Onset   Diabetes Father     Social History   Tobacco Use   Smoking status: Never  Substance Use Topics   Alcohol use: Yes    Comment: beer   Drug use: Never    Home Medications Prior to Admission medications   Medication Sig Start Date End Date Taking? Authorizing Provider  acetaminophen (TYLENOL) 500 MG tablet Take 1,000 mg by mouth every 6 (six) hours as needed for mild pain or headache.    [provider]  amLODipine (NORVASC) 10 MG tablet Take 10 mg by  mouth daily.    [provider]  aspirin EC 81 MG tablet Take 81 mg by mouth daily.    [provider]  Calcium Carbonate-Vitamin D (CALTRATE 600+D PO) Take 1 tablet by mouth 2 (two) times daily.    [provider]  cetirizine (ZYRTEC) 10 MG tablet Take 10 mg by mouth daily. 04/19/21   [provider]  diclofenac (VOLTAREN) 75 MG EC tablet Take 75 mg by mouth daily as needed (for pain).    [provider]  DULoxetine (CYMBALTA) 30 MG capsule Take 30 mg by mouth daily. 07/01/21   [provider]  losartan (COZAAR) 100 MG tablet Take 100 mg by mouth daily. 05/28/21   [provider]  Multiple Vitamin (MULTIVITAMIN WITH MINERALS) TABS tablet Take 1 tablet by mouth daily.    [provider]  pravastatin (PRAVACHOL) 20 MG tablet Take 20 mg by mouth daily.    [provider]  ranitidine (ZANTAC) 150 MG tablet Take 150 mg by mouth 2 (two) times daily as needed for heartburn.    [provider]  traMADol (ULTRAM) 50 MG tablet Take 50 mg by mouth every 6 (six) hours. 06/02/21   [provider]  Vitamin D, Cholecalciferol, 1000  UNITS CAPS Take 1,000 Units by mouth daily.    [provider]    Allergies    Patient has no known allergies.  Review of Systems   Review of Systems  Constitutional:  Negative for chills and fever.  HENT:  Negative for ear pain and sore throat.   Eyes:  Negative for pain and visual disturbance.  Respiratory:  Negative for cough and shortness of breath.   Cardiovascular:  Negative for chest pain and palpitations.  Gastrointestinal:  Negative for abdominal pain and vomiting.  Genitourinary:  Negative for dysuria and hematuria.  Musculoskeletal:  Negative for arthralgias and back pain.  Skin:  Positive for rash. Negative for color change.  Neurological:  Negative for seizures and syncope.  All other systems reviewed and are negative.  Physical Exam Updated Vital  Signs BP (!) 142/119 (BP Location: Left Arm)   Pulse (!) 117   Temp 98.4 F (36.9 C) (Oral)   Resp 16   Ht 1.473 m (4\' 10" )   Wt 51.7 kg   SpO2 100%   BMI 23.83 kg/m   Physical Exam Vitals and nursing note reviewed.  Constitutional:      General: She is not in acute distress.    Appearance: Normal appearance. She is well-developed.  HENT:     Head: Normocephalic and atraumatic.     Ears:     Comments: Clear filled vesicles to the right forehead and kind of the right temporal area.  Does not cross the midline.  Swelling to the upper eyelid but no vesicles on the upper eyelid.  No vesicles on the bridge or the tip of the nose.  Some of the vesicles are open and oozing. Eyes:     Extraocular Movements: Extraocular movements intact.     Conjunctiva/sclera: Conjunctivae normal.     Pupils: Pupils are equal, round, and reactive to light.  Cardiovascular:     Rate and Rhythm: Normal rate and regular rhythm.     Heart sounds: No murmur heard. Pulmonary:     Effort: Pulmonary effort is normal. No respiratory distress.     Breath sounds: Normal breath sounds.  Abdominal:     Palpations: Abdomen is soft.     Tenderness: There is no abdominal tenderness.  Musculoskeletal:        General: No swelling.     Cervical back: Neck supple.  Skin:    General: Skin is warm and dry.     Capillary Refill: Capillary refill takes less than 2 seconds.     Findings: Rash present.  Neurological:     General: No focal deficit present.     Mental Status: She is alert. Mental status is at baseline.  Psychiatric:        Mood and Affect: Mood normal.    ED Results / Procedures / Treatments   Labs (all labs ordered are listed, but only abnormal results are displayed) Labs Reviewed - No data to display  EKG None  Radiology No results found.  Procedures Procedures   Medications Ordered in ED Medications - No data to display  ED Course  I have reviewed the triage vital signs and the  nursing notes.  Pertinent labs & imaging results that were available during my care of the patient were reviewed by me and considered in my medical decision making (see chart for details).    MDM Rules/Calculators/A&P  Patient nontoxic no acute distress.  Findings consistent with shingles.  No eye involvement.  Not likely to occur since there is no vesicles on the tip of the nose or even the bridge of the nose.  Patient has follow-up with primary care doctor to go over the culture results on Monday.  She is on Valtrex she is taking the Valtrex.  Final Clinical Impression(s) / ED Diagnoses Final diagnoses:  Herpes zoster without complication    Rx / DC Orders ED Discharge Orders     None        Vanetta Mulders, MD 07/09/21 1755

## 2021-07-09 NOTE — ED Triage Notes (Signed)
Patient c/o a rash above the right eye x 3 days. Patient went to her PCP yesterday and states that they took a biopsy and told her to come to the ED. Rash is draining.

## 2022-08-13 IMAGING — DX DG ABD PORTABLE 1V
1 series · 1 of 1 positions shown · non-contrast
Comparison: 07/03/2021

CLINICAL DATA: Small-bowel obstruction

EXAM:
PORTABLE ABDOMEN - 1 VIEW

[abdomen kub]
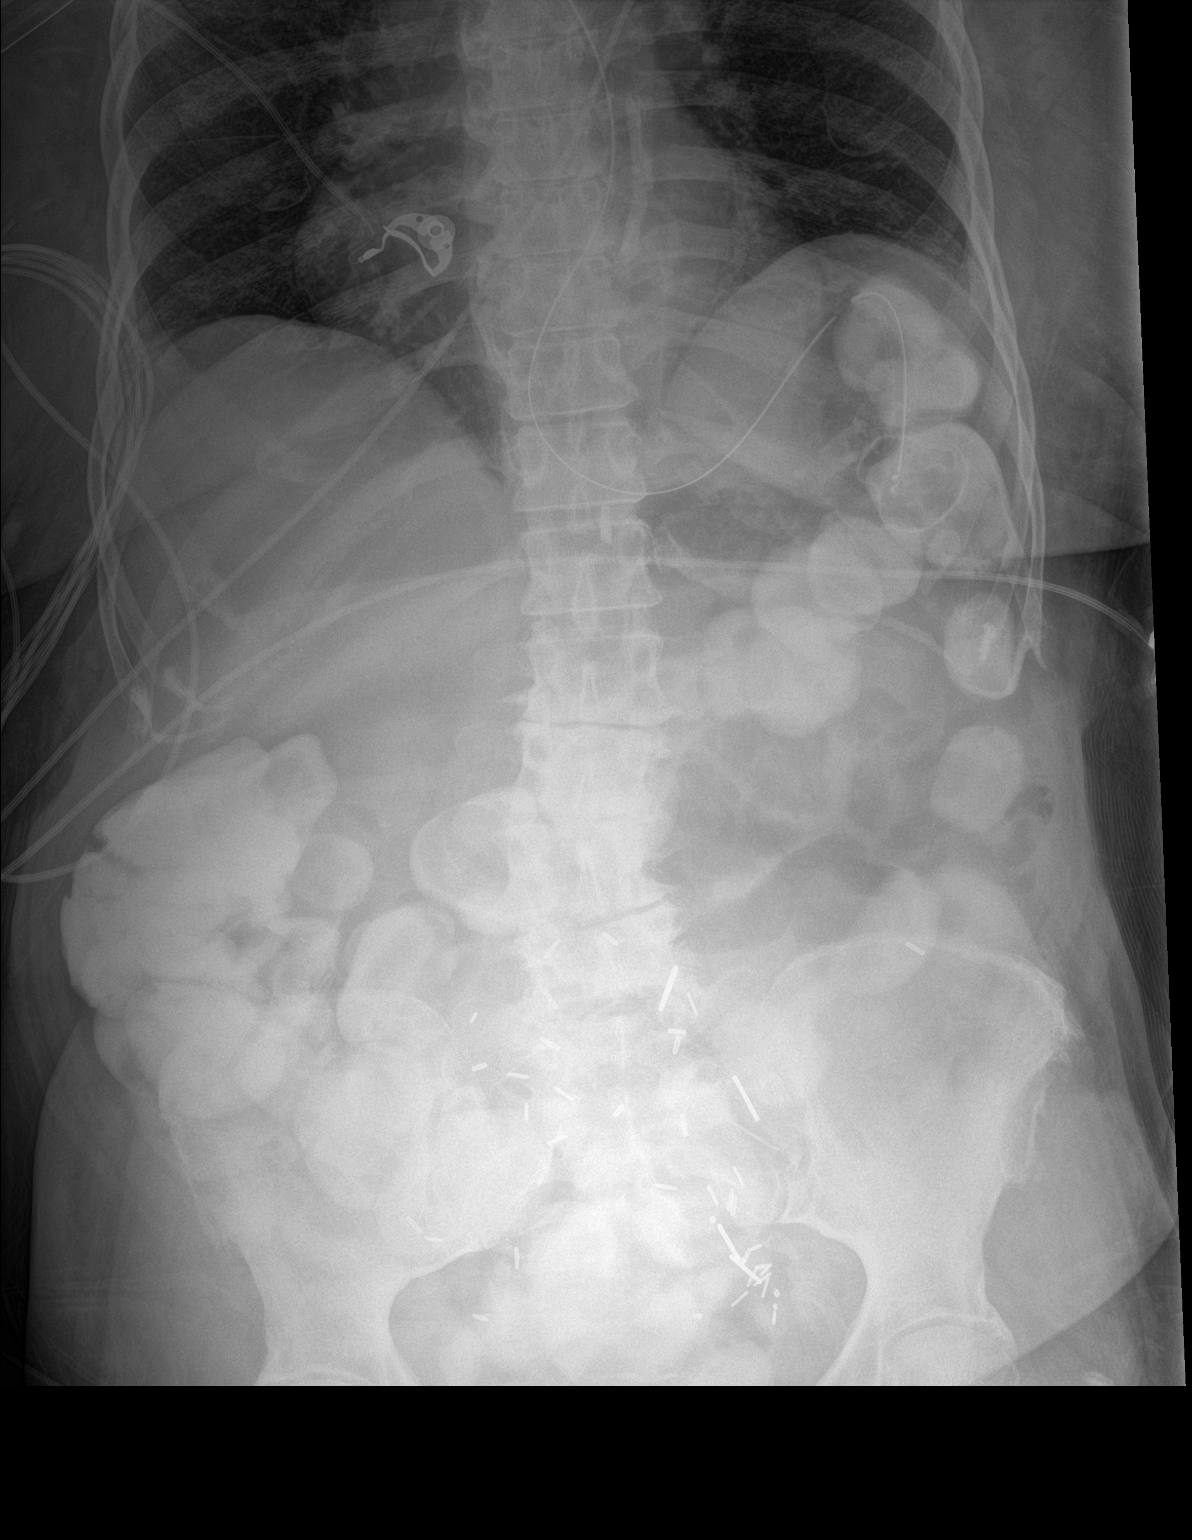

[1 of 1 positions shown; findings below may reference images not displayed]

FINDINGS: Supine frontal view of the abdomen and pelvis demonstrates oral
contrast throughout the colon. Decreased caliber of the gas-filled
small bowel loops within the left upper quadrant. No evidence of
high-grade obstruction. Enteric catheter tip and side port project
over the gastric fundus. No masses or abnormal calcifications.
IMPRESSION: 1. Progression of oral contrast throughout the colon, with no
evidence of high-grade small bowel obstruction. Decreased gaseous
distention of the small bowel.

## 2022-08-14 IMAGING — DX DG ABD PORTABLE 1V
1 series · 1 of 1 positions shown · non-contrast
Comparison: 07/04/2021

CLINICAL DATA: Small-bowel obstruction

EXAM:
PORTABLE ABDOMEN - 1 VIEW

[abdomen kub]
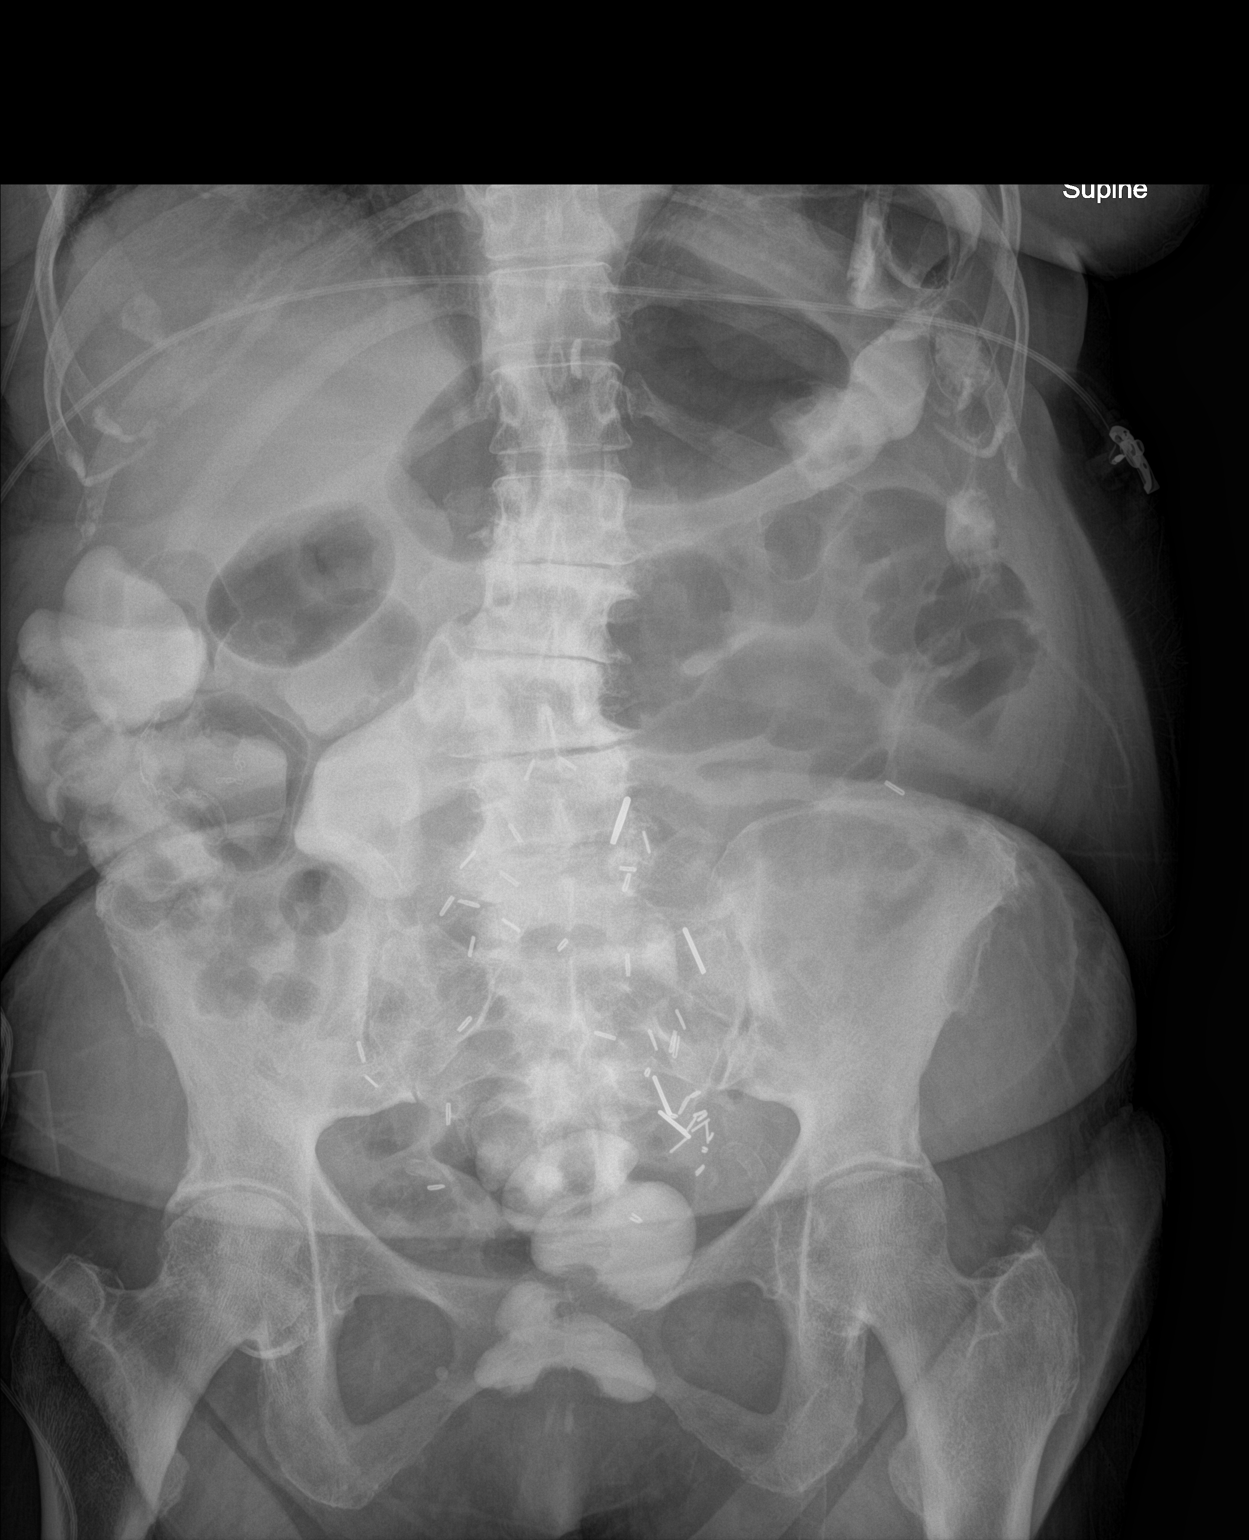

[1 of 1 positions shown; findings below may reference images not displayed]

FINDINGS: Supine frontal view of the abdomen and pelvis demonstrates
progressive oral contrast throughout the colon to the level of the
rectum. Decreased gaseous distention of the small bowel. No masses
or abnormal calcifications. Extensive postsurgical changes
throughout the lower abdomen and pelvis with multiple surgical clips
identified.
IMPRESSION: 1. Further progression of oral contrast throughout the colon.
Decreased gaseous distention of the bowel with no evidence of
high-grade obstruction.

## 2024-04-04 ENCOUNTER — Telehealth: Payer: Self-pay | Admitting: Physician Assistant

## 2024-04-04 NOTE — Telephone Encounter (Signed)
 Called pt and left vm for pt to call [name] to reschedule Mandy Bowman appt

## 2024-04-10 ENCOUNTER — Ambulatory Visit: Admitting: Physician Assistant

## 2024-04-24 ENCOUNTER — Ambulatory Visit: Admitting: Physician Assistant

## 2024-05-01 ENCOUNTER — Ambulatory Visit: Admitting: Physician Assistant

## 2024-05-04 ENCOUNTER — Other Ambulatory Visit (INDEPENDENT_AMBULATORY_CARE_PROVIDER_SITE_OTHER): Payer: Self-pay

## 2024-05-04 ENCOUNTER — Ambulatory Visit: Admitting: Physician Assistant

## 2024-05-04 ENCOUNTER — Encounter: Payer: Self-pay | Admitting: Physician Assistant

## 2024-05-04 DIAGNOSIS — M545 Low back pain, unspecified: Secondary | ICD-10-CM

## 2024-05-04 DIAGNOSIS — M25521 Pain in right elbow: Secondary | ICD-10-CM | POA: Diagnosis not present

## 2024-05-04 DIAGNOSIS — M25511 Pain in right shoulder: Secondary | ICD-10-CM

## 2024-05-04 DIAGNOSIS — G8929 Other chronic pain: Secondary | ICD-10-CM

## 2024-05-04 MED ORDER — LIDOCAINE HCL 1 % IJ SOLN
5.0000 mL | INTRAMUSCULAR | Status: AC | PRN
  Administered 2024-05-04: 5 mL

## 2024-05-04 MED ORDER — METHYLPREDNISOLONE ACETATE 40 MG/ML IJ SUSP
40.0000 mg | INTRAMUSCULAR | Status: AC | PRN
  Administered 2024-05-04: 40 mg via INTRA_ARTICULAR

## 2024-05-04 NOTE — Progress Notes (Signed)
 Office Visit Note   Patient: Mandy Bowman           Date of Birth: [birth year]           MRN: [id number] Visit Date: 05/04/2024              Requested by: Rena Luke POUR, MD [address] [place],  [place] [address] PCP: Rena Luke POUR, MD   Assessment & Plan: Visit Diagnoses:  1. Chronic right shoulder pain   2. Chronic midline low back pain, unspecified whether sciatica present     Plan: Patient will engage with physical therapy for her shoulder.  If subacromial injection does not help her could consider intra-articular injection.  Also will begin therapy for her back.  If she does not get good relief could consider an MRI she would need an open MRI.  As she is claustrophobic.  Follow-Up Instructions: No follow-ups on file.   Orders:  Orders Placed This Encounter  Procedures  . XR Lumbar Spine 2-3 Views  . XR Shoulder Right   No orders of the defined types were placed in this encounter.     Procedures: Large Joint Inj: R subacromial bursa on 05/04/2024 3:58 PM Indications: diagnostic evaluation and pain Details: 25 G 1.5 in needle, posterior approach  Arthrogram: No  Medications: 5 mL lidocaine  1 %; 40 mg methylPREDNISolone  acetate 40 MG/ML Outcome: tolerated well, no immediate complications Procedure, treatment alternatives, risks and benefits explained, specific risks discussed. Consent was given by the patient.      Clinical Data: No additional findings.   Subjective: Chief Complaint  Patient presents with  . Right Shoulder - Pain  . Lower Back - Pain    HPI patient is a pleasant 83 year old woman who comes in today with a chief complaint of focal low back pain.  Does not radiate down her legs denies any particular injury.  Denies any loss of changes in bowel or bladder control.  Denies any paresthesias or weakness.  Also complaining of right shoulder pain and right elbow stiffness.  Denies any particular injuries though her daughter  says she has taken some falls.  She is taking Tylenol .  She is left-hand dominant.  Review of Systems  All other systems reviewed and are negative.    Objective: Vital Signs: There were no vitals taken for this visit.  Physical Exam Constitutional:      Appearance: Normal appearance.  Pulmonary:     Effort: Pulmonary effort is normal.  Skin:    General: Skin is warm and dry.  Neurological:     Mental Status: She is alert.  Psychiatric:        Mood and Affect: Mood normal.        Behavior: Behavior normal.     Ortho Exam Examination of her right shoulder she has good forward elevation internal rotation behind her back does not bother her.  She has got fair grip strength.  She cannot extend her elbow past about 90 degrees.  No redness no erythema.  She does not really have reproducible pain in her shoulder with external rotation.  Again impingement tests are somewhat difficult because of her elbow stiffness.  But does not seem to have any positive stiffness.  Either empty can or with speeds testing. Examination of her back she has no redness no erythema no step-offs.  She has no lower extremity findings including no paresthesias no strength loss she is neurovascularly intact she has good strength  with resisted elevation of her legs negative straight leg raise Specialty Comments:  No specialty comments available.  Imaging: No results found.   PMFS History: Patient Active Problem List   Diagnosis Date Noted  . AKI (acute kidney injury) ([facility]) 07/04/2021  . HTN (hypertension) 07/04/2021  . Partial small bowel obstruction ([facility]) 07/03/2021   Past Medical History:  Diagnosis Date  . HLD (hyperlipidemia)   . Hypertension     Family History  Problem Relation Age of Onset  . Diabetes Father     Past Surgical History:  Procedure Laterality Date  . ABDOMINAL SURGERY    . COLON SURGERY    . COLOSTOMY REVERSAL     Social History   Occupational History  . Not on file   Tobacco Use  . Smoking status: Never  . Smokeless tobacco: Not on file  Substance and Sexual Activity  . Alcohol use: Yes    Comment: beer  . Drug use: Never  . Sexual activity: Not on file

## 2024-06-06 ENCOUNTER — Ambulatory Visit

## 2024-06-18 ENCOUNTER — Encounter: Payer: Self-pay | Admitting: Radiology
